# Patient Record
Sex: Male | Born: 1955 | Race: White | Hispanic: No | Marital: Married | Smoking: Former smoker
Health system: Southern US, Community
[De-identification: ages and names within clinical notes are randomized; demographics above are authoritative.]

## PROBLEM LIST (undated history)

## (undated) DIAGNOSIS — N289 Disorder of kidney and ureter, unspecified: Secondary | ICD-10-CM

## (undated) DIAGNOSIS — K219 Gastro-esophageal reflux disease without esophagitis: Secondary | ICD-10-CM

## (undated) DIAGNOSIS — F419 Anxiety disorder, unspecified: Secondary | ICD-10-CM

## (undated) DIAGNOSIS — E785 Hyperlipidemia, unspecified: Secondary | ICD-10-CM

## (undated) DIAGNOSIS — M1990 Unspecified osteoarthritis, unspecified site: Secondary | ICD-10-CM

---

## 2006-03-18 DIAGNOSIS — C801 Malignant (primary) neoplasm, unspecified: Secondary | ICD-10-CM

## 2006-03-18 HISTORY — DX: Malignant (primary) neoplasm, unspecified: C80.1

## 2006-03-18 HISTORY — PX: LUNG REMOVAL, PARTIAL: SHX233

## 2020-03-18 HISTORY — PX: ABDOMINAL AORTIC ANEURYSM REPAIR: SUR1152

## 2021-09-06 ENCOUNTER — Other Ambulatory Visit: Payer: Self-pay

## 2021-09-06 ENCOUNTER — Encounter (HOSPITAL_COMMUNITY): Payer: Self-pay

## 2021-09-06 ENCOUNTER — Emergency Department (HOSPITAL_COMMUNITY): Payer: Medicare PPO

## 2021-09-06 ENCOUNTER — Emergency Department (HOSPITAL_COMMUNITY)
Admission: EM | Admit: 2021-09-06 | Discharge: 2021-09-07 | Disposition: A | Discharge status: Home / Self Care | Payer: Medicare PPO | Attending: Emergency Medicine | Admitting: Emergency Medicine

## 2021-09-06 DIAGNOSIS — R079 Chest pain, unspecified: Secondary | ICD-10-CM

## 2021-09-06 DIAGNOSIS — R0602 Shortness of breath: Secondary | ICD-10-CM | POA: Diagnosis not present

## 2021-09-06 DIAGNOSIS — Z85118 Personal history of other malignant neoplasm of bronchus and lung: Secondary | ICD-10-CM | POA: Insufficient documentation

## 2021-09-06 DIAGNOSIS — R072 Precordial pain: Secondary | ICD-10-CM | POA: Diagnosis not present

## 2021-09-06 LAB — CBC
HCT: 50.4 % (ref 39.0–52.0)
Hemoglobin: 16.3 g/dL (ref 13.0–17.0)
MCH: 27.3 pg (ref 26.0–34.0)
MCHC: 32.3 g/dL (ref 30.0–36.0)
MCV: 84.3 fL (ref 80.0–100.0)
Platelets: 189 10*3/uL (ref 150–400)
RBC: 5.98 MIL/uL — ABNORMAL HIGH (ref 4.22–5.81)
RDW: 15 % (ref 11.5–15.5)
WBC: 7.4 10*3/uL (ref 4.0–10.5)
nRBC: 0 % (ref 0.0–0.2)

## 2021-09-06 LAB — BASIC METABOLIC PANEL
Anion gap: 9 (ref 5–15)
BUN: 19 mg/dL (ref 8–23)
CO2: 26 mmol/L (ref 22–32)
Calcium: 9.5 mg/dL (ref 8.9–10.3)
Chloride: 102 mmol/L (ref 98–111)
Creatinine, Ser: 1.29 mg/dL — ABNORMAL HIGH (ref 0.61–1.24)
GFR, Estimated: >60 mL/min (ref >60)
Glucose, Bld: 94 mg/dL (ref 70–99)
Potassium: 4.3 mmol/L (ref 3.5–5.1)
Sodium: 137 mmol/L (ref 135–145)

## 2021-09-06 LAB — TROPONIN I (HIGH SENSITIVITY)
Troponin I (High Sensitivity): 4 ng/L (ref <18)
Troponin I (High Sensitivity): 4 ng/L (ref <18)

## 2021-09-06 MED ORDER — FAMOTIDINE IN NACL 20-0.9 MG/50ML-% IV SOLN
20.0000 mg | Freq: Once | INTRAVENOUS | Status: AC
  Administered 2021-09-06: 20 mg via INTRAVENOUS
  Filled 2021-09-06: qty 50

## 2021-09-06 MED ORDER — FENTANYL CITRATE PF 50 MCG/ML IJ SOSY
100.0000 ug | PREFILLED_SYRINGE | Freq: Once | INTRAMUSCULAR | Status: AC
  Administered 2021-09-06: 100 ug via INTRAVENOUS
  Filled 2021-09-06: qty 2

## 2021-09-06 MED ORDER — DIPHENHYDRAMINE HCL 25 MG PO CAPS
50.0000 mg | ORAL_CAPSULE | Freq: Once | ORAL | Status: AC
  Administered 2021-09-07: 50 mg via ORAL

## 2021-09-06 MED ORDER — DIPHENHYDRAMINE HCL 50 MG/ML IJ SOLN
50.0000 mg | Freq: Once | INTRAMUSCULAR | Status: AC
  Administered 2021-09-06: 50 mg via INTRAVENOUS
  Filled 2021-09-06: qty 1

## 2021-09-06 MED ORDER — METHYLPREDNISOLONE SODIUM SUCC 40 MG IJ SOLR
40.0000 mg | Freq: Once | INTRAMUSCULAR | Status: AC
Start: 2021-09-06 — End: 2021-09-06
  Administered 2021-09-06: 40 mg via INTRAVENOUS
  Filled 2021-09-06: qty 1

## 2021-09-06 MED ORDER — DIPHENHYDRAMINE HCL 25 MG PO CAPS
50.0000 mg | ORAL_CAPSULE | Freq: Once | ORAL | Status: AC
Start: 2021-09-06 — End: 2021-09-06
  Filled 2021-09-06: qty 2

## 2021-09-06 MED ORDER — ALUM & MAG HYDROXIDE-SIMETH 200-200-20 MG/5ML PO SUSP
30.0000 mL | Freq: Once | ORAL | Status: AC
  Administered 2021-09-06: 30 mL via ORAL
  Filled 2021-09-06: qty 30

## 2021-09-06 MED ORDER — LIDOCAINE VISCOUS HCL 2 % MT SOLN
15.0000 mL | Freq: Once | OROMUCOSAL | Status: AC
  Administered 2021-09-06: 15 mL via ORAL
  Filled 2021-09-06: qty 15

## 2021-09-06 NOTE — ED Provider Notes (Signed)
[facility] EMERGENCY DEPARTMENT Provider Note   CSN: [id number] Arrival date & time: 09/06/21  1427     History  Chief Complaint  Patient presents with   Chest Pain    Troy Warner is a 66 y.o. male.  HPI  66 year old male with past medical history of AAA status post repair, lung [place]) status post lobectomy presents emergency department with concern of midsternal chest pain that started about 1:00 today.  Patient states this was associated with shortness of breath.  States radiated to his neck but did not radiate to his back or abdomen.  Patient said it initially felt like reflux, he took over-the-counter medication without any significant improvement.  Patient did not have any complications status post AAA repair.  Denies any recent fever or other acute illness.  Home Medications Prior to Admission medications   Not on File      Allergies    Ivp dye [iodinated contrast media], Morphine, and Plaquenil [hydroxychloroquine]    Review of Systems   Review of Systems  Constitutional:  Positive for fatigue. Negative for fever.  Respiratory:  Positive for shortness of breath.   Cardiovascular:  Positive for chest pain.  Gastrointestinal:  Negative for abdominal pain, diarrhea and vomiting.  Musculoskeletal:  Positive for back pain.  Skin:  Negative for rash.  Neurological:  Negative for headaches.    Physical Exam Updated Vital Signs BP (!) 126/91   Pulse (!) 104   Temp 98.6 F (37 C) (Oral)   Resp 19   SpO2 95%  Physical Exam Vitals and nursing note reviewed.  Constitutional:      General: He is not in acute distress.    Appearance: Normal appearance.  HENT:     Head: Normocephalic.     Mouth/Throat:     Mouth: Mucous membranes are moist.  Cardiovascular:     Rate and Rhythm: Normal rate.  Pulmonary:     Effort: Pulmonary effort is normal. No respiratory distress.     Breath sounds: No decreased breath sounds.   Abdominal:     Palpations: Abdomen is soft.     Tenderness: There is no abdominal tenderness.  Skin:    General: Skin is warm.  Neurological:     Mental Status: He is alert and oriented to person, place, and time. Mental status is at baseline.  Psychiatric:        Mood and Affect: Mood normal.     ED Results / Procedures / Treatments   Labs (all labs ordered are listed, but only abnormal results are displayed) Labs Reviewed  BASIC METABOLIC PANEL - Abnormal; Notable for the following components:      Result Value   Creatinine, Ser 1.29 (*)    All other components within normal limits  CBC - Abnormal; Notable for the following components:   RBC 5.98 (*)    All other components within normal limits  CBG MONITORING, ED  TROPONIN I (HIGH SENSITIVITY)  TROPONIN I (HIGH SENSITIVITY)    EKG EKG Interpretation  Date/Time:  Thursday September 06 2021 14:34:04 EDT Ventricular Rate:  94 PR Interval:  192 QRS Duration: 82 QT Interval:  316 QTC Calculation: 395 R Axis:   5 Text Interpretation: Normal sinus rhythm Normal ECG No previous ECGs available Confirmed by Coralee Pesa [phone]) on 09/06/2021 8:01:50 PM  Radiology DG Chest 2 View  Result Date: 09/06/2021 CLINICAL DATA:  Chest pain.  History of right lobectomy in 2008. EXAM: CHEST -  2 VIEW COMPARISON:  None Available. FINDINGS: Cardiac silhouette appears mildly enlarged. Mediastinal contours are within normal limits with moderately tortuous thoracic aorta seen. Surgical clips overlie the right hilum. There is mildly decreased right lung volume consistent with prior lobectomy. Old healed fracture versus thoracotomy site within the posterolateral right sixth rib. No focal airspace opacity to indicate pneumonia. Blunting of the lateral and anterior right costophrenic angles presumably postsurgical. No pleural effusion is seen at the posterior costophrenic angles on lateral view. No pneumothorax is seen. Mild multilevel degenerative disc  changes of the thoracic spine. Surgical clips overlie the upper abdomen. Possible vascular stent in this region. IMPRESSION: 1. Postsurgical changes of right lobectomy with right-sided volume loss. 2. No definite acute lung process. Electronically Signed   By: Neita Garnet M.D.   On: 09/06/2021 14:58    Procedures Procedures    Medications Ordered in ED Medications  famotidine (PEPCID) IVPB 20 mg premix (20 mg Intravenous New Bag/Given 09/06/21 2059)  fentaNYL (SUBLIMAZE) injection 100 mcg (100 mcg Intravenous Given 09/06/21 2056)  methylPREDNISolone sodium succinate (SOLU-MEDROL) 40 mg/mL injection 40 mg (40 mg Intravenous Given 09/06/21 2055)  diphenhydrAMINE (BENADRYL) capsule 50 mg ( Oral See Alternative 09/06/21 2103)    Or  diphenhydrAMINE (BENADRYL) injection 50 mg (50 mg Intravenous Given 09/06/21 2103)  alum & mag hydroxide-simeth (MAALOX/MYLANTA) 200-200-20 MG/5ML suspension 30 mL (30 mLs Oral Given 09/06/21 2059)    And  lidocaine (XYLOCAINE) 2 % viscous mouth solution 15 mL (15 mLs Oral Given 09/06/21 2059)    ED Course/ Medical Decision Making/ A&P                           Medical Decision Making Amount and/or Complexity of Data Reviewed Radiology: ordered.  Risk OTC drugs. Prescription drug management.   66 year old male with past medical history of lobectomy and [place] the emergency department with chest pain and shortness of breath.  Patient at 1 point was tachycardic, normal vitals on my evaluation.  Complaining of ongoing severe midsternal chest discomfort that radiates to his neck.  EKG shows no ischemic changes.  Cardiac work-up is reassuring with negative troponins, blood work is otherwise normal.  After IV medications, GI meds he feels improved.  However given his prominent history we will pursue imaging of CT chest/abdomen/pelvis to rule out any acute vascular abnormality.  Patient has contrast allergy that involves itching.  He  has been pretreated and scanned before without issue.  We initiated pretreatment.  1203: Patient reevaluated, sleeping.  States that his symptoms are improved.  Offers no new complaints.  Plan to take the patient for CT in about an hour after another pretreatment dose.  Patient signed out pending CT imaging and reevaluation.        Final Clinical Impression(s) / ED Diagnoses Final diagnoses:  None    Rx / DC Orders ED Discharge Orders     None         Rozelle Logan, DO 09/07/21 0004

## 2021-09-06 NOTE — ED Triage Notes (Signed)
Patient complains of chest pain with radiation to neck and arms starting at approximately 1400 today. Patient is alert, oriented, and in no apparent distress at this time.

## 2021-09-07 ENCOUNTER — Emergency Department (HOSPITAL_COMMUNITY): Payer: Medicare PPO

## 2021-09-07 LAB — CREATININE, SERUM
Creatinine, Ser: 0.94 mg/dL (ref 0.61–1.24)
GFR, Estimated: >60 mL/min (ref >60)

## 2021-09-07 MED ORDER — PANTOPRAZOLE SODIUM 40 MG PO TBEC: 40.0000 mg | DELAYED_RELEASE_TABLET | Freq: Every day | ORAL | 0 refills | Status: AC

## 2021-09-07 MED ORDER — PANTOPRAZOLE SODIUM 40 MG PO TBEC
40.0000 mg | DELAYED_RELEASE_TABLET | Freq: Once | ORAL | Status: AC
  Administered 2021-09-07: 40 mg via ORAL
  Filled 2021-09-07: qty 1

## 2021-09-07 MED ORDER — IOHEXOL 350 MG/ML SOLN
100.0000 mL | Freq: Once | INTRAVENOUS | Status: AC | PRN
  Administered 2021-09-07: 100 mL via INTRAVENOUS

## 2022-09-07 ENCOUNTER — Emergency Department (HOSPITAL_BASED_OUTPATIENT_CLINIC_OR_DEPARTMENT_OTHER): Payer: Medicare PPO

## 2022-09-07 ENCOUNTER — Inpatient Hospital Stay (HOSPITAL_BASED_OUTPATIENT_CLINIC_OR_DEPARTMENT_OTHER)
Admission: EM | Admit: 2022-09-07 | Discharge: 2022-09-09 | DRG: 253 | Disposition: A | Discharge status: Home / Self Care | Payer: Medicare PPO | Attending: Vascular Surgery | Admitting: Vascular Surgery

## 2022-09-07 ENCOUNTER — Other Ambulatory Visit: Payer: Self-pay

## 2022-09-07 ENCOUNTER — Encounter (HOSPITAL_BASED_OUTPATIENT_CLINIC_OR_DEPARTMENT_OTHER): Payer: Self-pay | Admitting: Emergency Medicine

## 2022-09-07 DIAGNOSIS — Z7902 Long term (current) use of antithrombotics/antiplatelets: Secondary | ICD-10-CM | POA: Diagnosis not present

## 2022-09-07 DIAGNOSIS — Z885 Allergy status to narcotic agent status: Secondary | ICD-10-CM

## 2022-09-07 DIAGNOSIS — Z888 Allergy status to other drugs, medicaments and biological substances status: Secondary | ICD-10-CM | POA: Diagnosis not present

## 2022-09-07 DIAGNOSIS — Z87891 Personal history of nicotine dependence: Secondary | ICD-10-CM

## 2022-09-07 DIAGNOSIS — N182 Chronic kidney disease, stage 2 (mild): Secondary | ICD-10-CM | POA: Diagnosis present

## 2022-09-07 DIAGNOSIS — Z79899 Other long term (current) drug therapy: Secondary | ICD-10-CM

## 2022-09-07 DIAGNOSIS — Z91041 Radiographic dye allergy status: Secondary | ICD-10-CM | POA: Diagnosis not present

## 2022-09-07 DIAGNOSIS — I998 Other disorder of circulatory system: Secondary | ICD-10-CM | POA: Diagnosis not present

## 2022-09-07 DIAGNOSIS — E785 Hyperlipidemia, unspecified: Secondary | ICD-10-CM | POA: Diagnosis present

## 2022-09-07 DIAGNOSIS — M549 Dorsalgia, unspecified: Secondary | ICD-10-CM | POA: Diagnosis present

## 2022-09-07 DIAGNOSIS — Z8679 Personal history of other diseases of the circulatory system: Secondary | ICD-10-CM | POA: Diagnosis not present

## 2022-09-07 DIAGNOSIS — I739 Peripheral vascular disease, unspecified: Secondary | ICD-10-CM | POA: Diagnosis not present

## 2022-09-07 DIAGNOSIS — I70222 Atherosclerosis of native arteries of extremities with rest pain, left leg: Principal | ICD-10-CM | POA: Diagnosis present

## 2022-09-07 DIAGNOSIS — I743 Embolism and thrombosis of arteries of the lower extremities: Secondary | ICD-10-CM | POA: Diagnosis not present

## 2022-09-07 DIAGNOSIS — I82412 Acute embolism and thrombosis of left femoral vein: Secondary | ICD-10-CM | POA: Diagnosis present

## 2022-09-07 DIAGNOSIS — N289 Disorder of kidney and ureter, unspecified: Secondary | ICD-10-CM | POA: Diagnosis not present

## 2022-09-07 DIAGNOSIS — M79662 Pain in left lower leg: Secondary | ICD-10-CM | POA: Diagnosis present

## 2022-09-07 DIAGNOSIS — M62262 Nontraumatic ischemic infarction of muscle, left lower leg: Secondary | ICD-10-CM | POA: Diagnosis not present

## 2022-09-07 DIAGNOSIS — I509 Heart failure, unspecified: Secondary | ICD-10-CM | POA: Diagnosis not present

## 2022-09-07 DIAGNOSIS — I70202 Unspecified atherosclerosis of native arteries of extremities, left leg: Principal | ICD-10-CM

## 2022-09-07 DIAGNOSIS — Z7982 Long term (current) use of aspirin: Secondary | ICD-10-CM

## 2022-09-07 HISTORY — DX: Disorder of kidney and ureter, unspecified: N28.9

## 2022-09-07 HISTORY — DX: Hyperlipidemia, unspecified: E78.5

## 2022-09-07 HISTORY — DX: Unspecified osteoarthritis, unspecified site: M19.90

## 2022-09-07 HISTORY — DX: Gastro-esophageal reflux disease without esophagitis: K21.9

## 2022-09-07 HISTORY — DX: Anxiety disorder, unspecified: F41.9

## 2022-09-07 LAB — BASIC METABOLIC PANEL
Anion gap: 10 (ref 5–15)
BUN: 16 mg/dL (ref 8–23)
CO2: 25 mmol/L (ref 22–32)
Calcium: 9.2 mg/dL (ref 8.9–10.3)
Chloride: 103 mmol/L (ref 98–111)
Creatinine, Ser: 1.59 mg/dL — ABNORMAL HIGH (ref 0.61–1.24)
GFR, Estimated: 47 mL/min — ABNORMAL LOW (ref >60)
Glucose, Bld: 94 mg/dL (ref 70–99)
Potassium: 3.9 mmol/L (ref 3.5–5.1)
Sodium: 138 mmol/L (ref 135–145)

## 2022-09-07 LAB — CBC WITH DIFFERENTIAL/PLATELET
Abs Immature Granulocytes: 0.01 10*3/uL (ref 0.00–0.07)
Basophils Absolute: 0 10*3/uL (ref 0.0–0.1)
Basophils Relative: 0 %
Eosinophils Absolute: 0.1 10*3/uL (ref 0.0–0.5)
Eosinophils Relative: 2 %
HCT: 48.5 % (ref 39.0–52.0)
Hemoglobin: 16.1 g/dL (ref 13.0–17.0)
Immature Granulocytes: 0 %
Lymphocytes Relative: 37 %
Lymphs Abs: 2 10*3/uL (ref 0.7–4.0)
MCH: 28.8 pg (ref 26.0–34.0)
MCHC: 33.2 g/dL (ref 30.0–36.0)
MCV: 86.8 fL (ref 80.0–100.0)
Monocytes Absolute: 0.7 10*3/uL (ref 0.1–1.0)
Monocytes Relative: 13 %
Neutro Abs: 2.6 10*3/uL (ref 1.7–7.7)
Neutrophils Relative %: 48 %
Platelets: 165 10*3/uL (ref 150–400)
RBC: 5.59 MIL/uL (ref 4.22–5.81)
RDW: 13.7 % (ref 11.5–15.5)
WBC: 5.4 10*3/uL (ref 4.0–10.5)
nRBC: 0 % (ref 0.0–0.2)

## 2022-09-07 MED ORDER — METOPROLOL TARTRATE 5 MG/5ML IV SOLN: 2.0000 mg | INTRAVENOUS | Status: DC | PRN

## 2022-09-07 MED ORDER — PANTOPRAZOLE SODIUM 40 MG PO TBEC: 40.0000 mg | DELAYED_RELEASE_TABLET | Freq: Every day | ORAL | Status: DC

## 2022-09-07 MED ORDER — LABETALOL HCL 5 MG/ML IV SOLN: 10.0000 mg | INTRAVENOUS | Status: DC | PRN

## 2022-09-07 MED ORDER — HEPARIN (PORCINE) 25000 UT/250ML-% IV SOLN
1700.0000 [IU]/h | INTRAVENOUS | Status: DC
  Administered 2022-09-07: 1700 [IU]/h via INTRAVENOUS
  Filled 2022-09-07: qty 250

## 2022-09-07 MED ORDER — ALUM & MAG HYDROXIDE-SIMETH 200-200-20 MG/5ML PO SUSP: 15.0000 mL | ORAL | Status: DC | PRN

## 2022-09-07 MED ORDER — HYDRALAZINE HCL 20 MG/ML IJ SOLN: 5.0000 mg | INTRAMUSCULAR | Status: DC | PRN

## 2022-09-07 MED ORDER — DIPHENHYDRAMINE HCL 25 MG PO CAPS: 50.0000 mg | ORAL_CAPSULE | Freq: Once | ORAL | Status: AC

## 2022-09-07 MED ORDER — HEPARIN BOLUS VIA INFUSION: 6000.0000 [IU] | Freq: Once | INTRAVENOUS | Status: DC

## 2022-09-07 MED ORDER — HEPARIN BOLUS VIA INFUSION
5500.0000 [IU] | Freq: Once | INTRAVENOUS | Status: AC
  Administered 2022-09-07: 5500 [IU] via INTRAVENOUS

## 2022-09-07 MED ORDER — PHENOL 1.4 % MT LIQD: 1.0000 | OROMUCOSAL | Status: DC | PRN

## 2022-09-07 MED ORDER — ONDANSETRON HCL 4 MG/2ML IJ SOLN: 4.0000 mg | Freq: Four times a day (QID) | INTRAMUSCULAR | Status: DC | PRN

## 2022-09-07 MED ORDER — POTASSIUM CHLORIDE CRYS ER 20 MEQ PO TBCR
20.0000 meq | EXTENDED_RELEASE_TABLET | Freq: Once | ORAL | Status: AC
  Administered 2022-09-08: 20 meq via ORAL
  Filled 2022-09-07: qty 1

## 2022-09-07 MED ORDER — METHYLPREDNISOLONE SODIUM SUCC 40 MG IJ SOLR
40.0000 mg | Freq: Once | INTRAMUSCULAR | Status: AC
  Administered 2022-09-07: 40 mg via INTRAVENOUS
  Filled 2022-09-07: qty 1

## 2022-09-07 MED ORDER — DIPHENHYDRAMINE HCL 50 MG/ML IJ SOLN
50.0000 mg | Freq: Once | INTRAMUSCULAR | Status: AC
  Administered 2022-09-07: 50 mg via INTRAVENOUS
  Filled 2022-09-07: qty 1

## 2022-09-07 MED ORDER — SODIUM CHLORIDE 0.9 % IV BOLUS
1000.0000 mL | Freq: Once | INTRAVENOUS | Status: AC
  Administered 2022-09-07: 1000 mL via INTRAVENOUS

## 2022-09-07 MED ORDER — IOHEXOL 350 MG/ML SOLN
100.0000 mL | Freq: Once | INTRAVENOUS | Status: AC | PRN
  Administered 2022-09-07: 100 mL via INTRAVENOUS

## 2022-09-07 MED ORDER — KCL IN DEXTROSE-NACL 20-5-0.45 MEQ/L-%-% IV SOLN
INTRAVENOUS | Status: DC
  Filled 2022-09-07 (×2): qty 1000

## 2022-09-07 MED ORDER — CEFAZOLIN SODIUM-DEXTROSE 2-4 GM/100ML-% IV SOLN
2.0000 g | INTRAVENOUS | Status: AC
  Administered 2022-09-08: 2 g via INTRAVENOUS

## 2022-09-07 MED ORDER — HYDROMORPHONE HCL 1 MG/ML IJ SOLN
0.5000 mg | INTRAMUSCULAR | Status: DC | PRN
  Administered 2022-09-08 – 2022-09-09 (×7): 1 mg via INTRAVENOUS
  Filled 2022-09-07 (×7): qty 1

## 2022-09-07 MED ORDER — GUAIFENESIN-DM 100-10 MG/5ML PO SYRP: 15.0000 mL | ORAL_SOLUTION | ORAL | Status: DC | PRN

## 2022-09-07 NOTE — ED Notes (Signed)
[facility] at bedside 

## 2022-09-07 NOTE — H&P (Incomplete)
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  He presents with occlusion of the left popliteal artery on CT scan and also evidence of a filling defect in the popliteal artery and tibial peroneal trunk.  It is not clear the etiology of this but this likely occurred 9 days ago on 08/29/2022.  He is currently on heparin and has a dampened and monophasic dorsalis pedis signal with a Doppler.  He is able to move the foot and has some paresthesias in the left foot.  His symptoms have been unchanged since the 13th.  I have recommended left femoral thrombectomy, popliteal and tibial thrombectomy.  He will possibly require fasciotomies.  There is a very small chance he would require a right to left femoral-femoral bypass.  Will leave him on heparin tonight and he is scheduled for surgery first thing in the morning.  I have reviewed the indications for the procedure and the potential complications with the patient and he is agreeable to proceed.  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  HPI:   Troy Warner is a 67 y.o. male who underwent endovascular aneurysm repair in 2009 in [place].  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  He subsequently underwent a FEVAR in 2022 by Dr. Shirl Harris at [facility].  He does admit to some left calf claudication but on June 13 he developed the sudden onset of pain in the left calf and numbness in the foot.  He was evaluated elsewhere where apparently had a venous duplex scan that was unremarkable.  His symptoms have persisted since that time and ultimately he elected to go to the emergency department today.  He underwent a CT angiogram at [facility] which showed occlusion of the left femoral artery with also some filling defect in the left popliteal artery and tibial peroneal trunk.  He was transferred to [facility] for further  evaluation.  His symptoms have not changed significantly since June 13.  The foot has been numb and he has had left foot pain.  Of note he just recently moved here from [place].  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  He quit smoking 23 years ago.  Past Medical History:  Diagnosis Date  . Cancer [facility]) 2008   Right lung  . Hyperlipidemia   . Renal disorder     History reviewed. No pertinent family history.  SOCIAL HISTORY: Social History   Tobacco Use  . Smoking status: Former    Types: Cigarettes    Passive exposure: Never  . Smokeless tobacco: Never  Substance Use Topics  . Alcohol use: Not Currently    Allergies  Allergen Reactions  . Iohexol Itching    Does well with pre-medication   . Ivp Dye [Iodinated Contrast Media] Itching    Does well with pre-medication  . Morphine Other (See Comments)    Delerium  . Otezla [Apremilast] Diarrhea and Nausea And Vomiting  . Plaquenil [Hydroxychloroquine]     Current Facility-Administered Medications  Medication Dose Route Frequency Provider Last Rate Last Admin  . alum & mag hydroxide-simeth (MAALOX/MYLANTA) 200-200-20 MG/5ML suspension 15-30 mL  15-30 mL Oral Q2H PRN Chuck Hint, MD      . Melene Muller ON 09/08/2022] ceFAZolin (ANCEF) IVPB 2g/100 mL  premix  2 g Intravenous On Call Chuck Hint, MD      . Melene Muller ON 09/08/2022] dextrose 5 % and 0.45 % NaCl with KCl 20 mEq/L infusion   Intravenous Continuous Chuck Hint, MD      . guaiFENesin-dextromethorphan [facility] DM) 100-10 MG/5ML syrup 15 mL  15 mL Oral Q4H PRN Chuck Hint, MD      . heparin ADULT infusion 100 units/mL (25000 units/224mL)  1,700 Units/hr Intravenous Continuous Rexford Maus, DO 17 mL/hr at 09/07/22 2135 1,700 Units/hr at 09/07/22 2135  . hydrALAZINE (APRESOLINE) injection 5 mg  5 mg Intravenous Q20 Min PRN  Chuck Hint, MD      . HYDROmorphone (DILAUDID) injection 0.5-1 mg  0.5-1 mg Intravenous Q2H PRN Chuck Hint, MD      . labetalol (NORMODYNE) injection 10 mg  10 mg Intravenous Q10 min PRN Chuck Hint, MD      . metoprolol tartrate (LOPRESSOR) injection 2-5 mg  2-5 mg Intravenous Q2H PRN Chuck Hint, MD      . ondansetron [facility]) injection 4 mg  4 mg Intravenous Q6H PRN Chuck Hint, MD      . Melene Muller ON 09/08/2022] pantoprazole (PROTONIX) EC tablet 40 mg  40 mg Oral Daily Chuck Hint, MD      . phenol [facility]) mouth spray 1 spray  1 spray Mouth/Throat PRN Chuck Hint, MD      . Melene Muller ON 09/08/2022] potassium chloride SA (KLOR-CON M) CR tablet 20-40 mEq  20-40 mEq Oral Once Chuck Hint, MD       Current Outpatient Medications  Medication Sig Dispense Refill  . aspirin EC 81 MG tablet Take 81 mg by mouth every evening. Swallow whole.    . chlorpheniramine (CHLOR-TRIMETON) 4 MG tablet Take 4 mg by mouth 2 (two) times daily as needed for allergies.    Marland Kitchen clopidogrel (PLAVIX) 75 MG tablet Take 75 mg by mouth every evening.    . docusate sodium (COLACE) 100 MG capsule Take 100 mg by mouth at bedtime.    . famotidine (PEPCID AC) 10 MG tablet Take 10 mg by mouth daily as needed for heartburn or indigestion.    Marland Kitchen HYDROcodone-acetaminophen (NORCO) 10-325 MG tablet Take 1 tablet by mouth 3 (three) times daily as needed for moderate pain or severe pain.    Marland Kitchen LORazepam (ATIVAN) 1 MG tablet Take 1 mg by mouth daily as needed for anxiety.    . nicotine polacrilex (COMMIT) 4 MG lozenge Take 4 mg by mouth daily as needed for smoking cessation.    . pantoprazole (PROTONIX) 40 MG tablet Take 1 tablet (40 mg total) by mouth daily. 30 tablet 0  . simethicone (MYLICON) 80 MG chewable tablet Chew 80 mg by mouth every 6 (six) hours as needed for flatulence.      REVIEW OF SYSTEMS:  [X]  denotes positive finding, [ ]  denotes  negative finding Cardiac  Comments:  Chest pain or chest pressure:    Shortness of breath upon exertion:    Short of breath when lying flat:    Irregular heart rhythm:        Vascular    Pain in calf, thigh, or hip brought on by ambulation: x   Pain in feet at night that wakes you up from your sleep:  x   Blood clot in your veins:    Leg swelling:         Pulmonary  Oxygen at home:    Productive cough:     Wheezing:         Neurologic    Sudden weakness in arms or legs:     Sudden numbness in arms or legs:  x   Sudden onset of difficulty speaking or slurred speech:    Temporary loss of vision in one eye:     Problems with dizziness:         Gastrointestinal    Blood in stool:     Vomited blood:         Genitourinary    Burning when urinating:     Blood in urine:        Psychiatric    Major depression:         Hematologic    Bleeding problems:    Problems with blood clotting too easily:        Skin    Rashes or ulcers:        Constitutional    Fever or chills:    -  PHYSICAL EXAM:   Vitals:   09/07/22 2000 09/07/22 2200 09/07/22 2345 09/07/22 2349  BP: (!) 137/90 129/81 (!) 138/91   Pulse: (!) 104 (!) 115 (!) 107   Resp: 19 20 (!) 21   Temp:    98.4 F (36.9 C)  TempSrc:    Oral  SpO2: 93% 93% 93%   Weight:      Height:       Body mass index is 34.87 kg/m. GENERAL: The patient is a well-nourished male, in no acute distress. The vital signs are documented above. CARDIAC: There is a regular rate and rhythm.  VASCULAR: I do not detect carotid bruits. He has a palpable right femoral pulse.  He has a palpable right dorsalis pedis pulse. I cannot palpate a left femoral pulse. He has a dampened monophasic dorsalis pedis signal with a Doppler. PULMONARY: There is good air exchange bilaterally without wheezing or rales. ABDOMEN: Soft and non-tender with normal pitched bowel sounds.  MUSCULOSKELETAL: There are no major deformities. NEUROLOGIC: He has  some paresthesias in the left foot but does have intact sensation and motor function. SKIN: There are no ulcers or rashes noted. PSYCHIATRIC: The patient has a normal affect.  DATA:    CT ANGIO: I reviewed the images of his CT angiogram.  This shows occlusion of the left femoral artery and also of the popliteal artery and tibial peroneal trunk.    Waverly Ferrari [facility]

## 2022-09-07 NOTE — ED Notes (Signed)
Vascular doctor Edilia Bo came bedside, unable to find pulse with doppler. Scheduled for thrombectomy in the morning.

## 2022-09-07 NOTE — ED Notes (Signed)
Doppler used on left foot, no pulse able to be found. MD Lynelle Doctor made aware, and vascular Dr. Edilia Bo notified via phone.

## 2022-09-07 NOTE — H&P (Signed)
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  He presents with occlusion of the left popliteal artery on CT scan and also evidence of a filling defect in the popliteal artery and tibial peroneal trunk.  It is not clear the etiology of this but this likely occurred 9 days ago on 08/29/2022.  He is currently on heparin and has a dampened and monophasic dorsalis pedis signal with a Doppler.  He is able to move the foot and has some paresthesias in the left foot.  His symptoms have been unchanged since the 13th.  I have recommended left femoral thrombectomy, popliteal and tibial thrombectomy.  He will possibly require fasciotomies.  There is a very small chance he would require a right to left femoral-femoral bypass.  Will leave him on heparin tonight and he is scheduled for surgery first thing in the morning.  I have reviewed the indications for the procedure and the potential complications with the patient and he is agreeable to proceed.  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  HPI:   Troy Warner is a 67 y.o. male who underwent endovascular aneurysm repair in 2009 in [place].  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  He subsequently underwent a FEVAR in 2022 by Dr. Shirl Harris at [facility].  He does admit to some left calf claudication but on June 13 he developed the sudden onset of pain in the left calf and numbness in the foot.  He was evaluated elsewhere where apparently had a venous duplex scan that was unremarkable.  His symptoms have persisted since that time and ultimately he elected to go to the emergency department today.  He underwent a CT angiogram at [facility] which showed occlusion of the left femoral artery with also some filling defect in the left popliteal artery and tibial peroneal trunk.  He was transferred to [facility] for further  evaluation.  His symptoms have not changed significantly since June 13.  The foot has been numb and he has had left foot pain.  Of note he just recently moved here from [place].  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  He quit smoking 23 years ago.  Past Medical History:  Diagnosis Date   Cancer [facility]) 2008   Right lung   Hyperlipidemia    Renal disorder     History reviewed. No pertinent family history.  SOCIAL HISTORY: Social History   Tobacco Use   Smoking status: Former    Types: Cigarettes    Passive exposure: Never   Smokeless tobacco: Never  Substance Use Topics   Alcohol use: Not Currently    Allergies  Allergen Reactions   Iohexol Itching    Does well with pre-medication    Ivp Dye [Iodinated Contrast Media] Itching    Does well with pre-medication   Morphine Other (See Comments)    Delerium   Otezla [Apremilast] Diarrhea and Nausea And Vomiting   Plaquenil [Hydroxychloroquine]     Current Facility-Administered Medications  Medication Dose Route Frequency Provider Last Rate Last Admin   alum & mag hydroxide-simeth (MAALOX/MYLANTA) 200-200-20 MG/5ML suspension 15-30 mL  15-30 mL Oral Q2H PRN Chuck Hint, MD       [START ON 09/08/2022] ceFAZolin (ANCEF) IVPB 2g/100 mL  premix  2 g Intravenous On Call Chuck Hint, MD       [START ON 09/08/2022] dextrose 5 % and 0.45 % NaCl with KCl 20 mEq/L infusion   Intravenous Continuous Chuck Hint, MD       guaiFENesin-dextromethorphan (ROBITUSSIN DM) 100-10 MG/5ML syrup 15 mL  15 mL Oral Q4H PRN Chuck Hint, MD       heparin ADULT infusion 100 units/mL (25000 units/249mL)  1,700 Units/hr Intravenous Continuous Rexford Maus, DO 17 mL/hr at 09/07/22 2135 1,700 Units/hr at 09/07/22 2135   hydrALAZINE (APRESOLINE) injection 5 mg  5 mg Intravenous Q20 Min PRN Chuck Hint, MD       HYDROmorphone (DILAUDID) injection 0.5-1 mg  0.5-1 mg Intravenous Q2H PRN Chuck Hint, MD       labetalol (NORMODYNE) injection 10 mg  10 mg Intravenous Q10 min PRN Chuck Hint, MD       metoprolol tartrate (LOPRESSOR) injection 2-5 mg  2-5 mg Intravenous Q2H PRN Chuck Hint, MD       ondansetron [facility]) injection 4 mg  4 mg Intravenous Q6H PRN Chuck Hint, MD       [START ON 09/08/2022] pantoprazole (PROTONIX) EC tablet 40 mg  40 mg Oral Daily Chuck Hint, MD       phenol (CHLORASEPTIC) mouth spray 1 spray  1 spray Mouth/Throat PRN Chuck Hint, MD       [START ON 09/08/2022] potassium chloride SA (KLOR-CON M) CR tablet 20-40 mEq  20-40 mEq Oral Once Chuck Hint, MD       Current Outpatient Medications  Medication Sig Dispense Refill   aspirin EC 81 MG tablet Take 81 mg by mouth every evening. Swallow whole.     chlorpheniramine (CHLOR-TRIMETON) 4 MG tablet Take 4 mg by mouth 2 (two) times daily as needed for allergies.     clopidogrel (PLAVIX) 75 MG tablet Take 75 mg by mouth every evening.     docusate sodium (COLACE) 100 MG capsule Take 100 mg by mouth at bedtime.     famotidine (PEPCID AC) 10 MG tablet Take 10 mg by mouth daily as needed for heartburn or indigestion.     HYDROcodone-acetaminophen (NORCO) 10-325 MG tablet Take 1 tablet by mouth 3 (three) times daily as needed for moderate pain or severe pain.     LORazepam (ATIVAN) 1 MG tablet Take 1 mg by mouth daily as needed for anxiety.     nicotine polacrilex (COMMIT) 4 MG lozenge Take 4 mg by mouth daily as needed for smoking cessation.     pantoprazole (PROTONIX) 40 MG tablet Take 1 tablet (40 mg total) by mouth daily. 30 tablet 0   simethicone (MYLICON) 80 MG chewable tablet Chew 80 mg by mouth every 6 (six) hours as needed for flatulence.      REVIEW OF SYSTEMS:  [X]  denotes positive finding, [ ]  denotes negative finding Cardiac   Comments:  Chest pain or chest pressure:    Shortness of breath upon exertion:    Short of breath when lying flat:    Irregular heart rhythm:        Vascular    Pain in calf, thigh, or hip brought on by ambulation: x   Pain in feet at night that wakes you up from your sleep:  x   Blood clot in your veins:    Leg swelling:         Pulmonary  Oxygen at home:    Productive cough:     Wheezing:         Neurologic    Sudden weakness in arms or legs:     Sudden numbness in arms or legs:  x   Sudden onset of difficulty speaking or slurred speech:    Temporary loss of vision in one eye:     Problems with dizziness:         Gastrointestinal    Blood in stool:     Vomited blood:         Genitourinary    Burning when urinating:     Blood in urine:        Psychiatric    Major depression:         Hematologic    Bleeding problems:    Problems with blood clotting too easily:        Skin    Rashes or ulcers:        Constitutional    Fever or chills:    -  PHYSICAL EXAM:   Vitals:   09/07/22 2000 09/07/22 2200 09/07/22 2345 09/07/22 2349  BP: (!) 137/90 129/81 (!) 138/91   Pulse: (!) 104 (!) 115 (!) 107   Resp: 19 20 (!) 21   Temp:    98.4 F (36.9 C)  TempSrc:    Oral  SpO2: 93% 93% 93%   Weight:      Height:       Body mass index is 34.87 kg/m. GENERAL: The patient is a well-nourished male, in no acute distress. The vital signs are documented above. CARDIAC: There is a regular rate and rhythm.  VASCULAR: I do not detect carotid bruits. He has a palpable right femoral pulse.  He has a palpable right dorsalis pedis pulse. I cannot palpate a left femoral pulse. He has a dampened monophasic dorsalis pedis signal with a Doppler. PULMONARY: There is good air exchange bilaterally without wheezing or rales. ABDOMEN: Soft and non-tender with normal pitched bowel sounds.  MUSCULOSKELETAL: There are no major deformities. NEUROLOGIC: He has some paresthesias in the left  foot but does have intact sensation and motor function. SKIN: There are no ulcers or rashes noted. PSYCHIATRIC: The patient has a normal affect.  DATA:    CT ANGIO: I reviewed the images of his CT angiogram.  This shows occlusion of the left femoral artery and also of the popliteal artery and tibial peroneal trunk.  LABS: His potassium is 3.9.  Creatinine 1.59.  GFR 47.  Hemoglobin 16.1.  Platelets 165,000.  Waverly Ferrari [facility]

## 2022-09-07 NOTE — Progress Notes (Signed)
ANTICOAGULATION CONSULT NOTE - Initial Consult  Pharmacy Consult for Heparin Indication:  Femoral artery occlusion  Allergies  Allergen Reactions   Iohexol Itching    Does well with pre-medication    Ivp Dye [Iodinated Contrast Media] Itching    Does well with pre-medication   Morphine Other (See Comments)    Delerium   Otezla [Apremilast] Diarrhea and Nausea And Vomiting   Plaquenil [Hydroxychloroquine]     Patient Measurements: Height: 5\' 11"  (180.3 cm) Weight: 113.4 kg (250 lb) IBW/kg (Calculated) : 75.3 Heparin Dosing Weight: 99.9kg  Vital Signs: Temp: 98.1 F (36.7 C) (06/22 1444) Temp Source: Oral (06/22 1444) BP: 137/90 (06/22 2000) Pulse Rate: 104 (06/22 2000)  Labs: Recent Labs    09/07/22 1612  HGB 16.1  HCT 48.5  PLT 165  CREATININE 1.59*    Estimated Creatinine Clearance: 56.9 mL/min (A) (by C-G formula based on SCr of 1.59 mg/dL (H)).   Medical History: Past Medical History:  Diagnosis Date   Cancer [facility]) 2008   Right lung   Hyperlipidemia    Renal disorder     Medications:  (Not in a hospital admission)  Scheduled:  Infusions:   sodium chloride     PRN:   Assessment: 17 yom with a history of AAA status post stenting . Patient is presenting with left leg pain. Heparin per pharmacy consult placed for  Femoral artery occlusion .  Patient is not on anticoagulation prior to arrival.  Hgb 16.1; plt 165  Goal of Therapy:  Heparin level 0.3-0.7 units/ml Monitor platelets by anticoagulation protocol: Yes   Plan:  Give IV heparin 5500 units bolus x 1 Start heparin infusion at 1700 units/hr Check anti-Xa level in 8 hours and daily while on heparin Continue to monitor H&H and platelets  Delmar Landau, PharmD, BCPS 09/07/2022 9:15 PM ED Clinical Pharmacist -  [phone]

## 2022-09-07 NOTE — ED Triage Notes (Addendum)
Pt via POV c/o left lower leg pain and numbness since last Thursday. He was doing yard work and felt a pull and a pop. Went to [facility]; workup showed no DVT and no fracture. Pt was discharged with rx for muscle relaxants and stretches. Pt has run out of muscle relaxer and says symptoms have not resolved. Called PCP and was advised to come for CT scan. Ambulatory to triage.

## 2022-09-07 NOTE — ED Provider Notes (Addendum)
[place] [facility] AT [facility] Provider Note   CSN: [id number] Arrival date & time: 09/07/22  1415     History  Chief Complaint  Patient presents with   Leg Injury    Troy Warner is a 67 y.o. male.  Patient is a 67 year old male with a past medical history of AAA status post stenting presenting to the emergency department with left leg pain.  The patient states about 2 weeks ago he was pulling a pool cover off and felt a severe pain in his left calf.  He states that he initially felt like he may have pulled a muscle and was seen at an outside ED where he had a negative DVT study and was given muscle relaxer for treatment.  He states that despite taking this his pain is increased.  He states that he is also having numbness in his leg and feels pins-and-needles on the bottom of his foot and on his anterior of his shin.  He states that the pain is worse with ambulating.  He denies any swelling to his leg.  He denies any associated back pain.  He denies any weakness.  The history is provided by the patient and the spouse.       Home Medications Prior to Admission medications   Medication Sig Start Date End Date Taking? Authorizing Provider  aspirin EC 81 MG tablet Take 81 mg by mouth every evening. Swallow whole.    [provider]  chlorpheniramine (CHLOR-TRIMETON) 4 MG tablet Take 4 mg by mouth 2 (two) times daily as needed for allergies.    [provider]  clopidogrel (PLAVIX) 75 MG tablet Take 75 mg by mouth every evening. 07/04/21   [provider]  docusate sodium (COLACE) 100 MG capsule Take 100 mg by mouth at bedtime.    [provider]  famotidine (PEPCID AC) 10 MG tablet Take 10 mg by mouth daily as needed for heartburn or indigestion.    [provider]  HYDROcodone-acetaminophen (NORCO) 10-325 MG tablet Take 1 tablet by mouth 3 (three) times daily as needed for moderate pain or severe pain.  08/13/21   [provider]  LORazepam (ATIVAN) 1 MG tablet Take 1 mg by mouth daily as needed for anxiety. 07/12/21   [provider]  nicotine polacrilex (COMMIT) 4 MG lozenge Take 4 mg by mouth daily as needed for smoking cessation.    [provider]  pantoprazole (PROTONIX) 40 MG tablet Take 1 tablet (40 mg total) by mouth daily. 09/07/21   Dione Booze, MD  simethicone (MYLICON) 80 MG chewable tablet Chew 80 mg by mouth every 6 (six) hours as needed for flatulence.    [provider]      Allergies    Iohexol, Ivp dye [iodinated contrast media], Morphine, Otezla [apremilast], and Plaquenil [hydroxychloroquine]    Review of Systems   Review of Systems  Physical Exam Updated Vital Signs BP (!) 137/90   Pulse (!) 104   Temp 98.1 F (36.7 C) (Oral)   Resp 19   Ht 5\' 11"  (1.803 m)   Wt 113.4 kg   SpO2 93%   BMI 34.87 kg/m  Physical Exam Vitals and nursing note reviewed.  Constitutional:      General: He is not in acute distress.    Appearance: Normal appearance.  HENT:     Head: Normocephalic and atraumatic.     Nose: Nose normal.     Mouth/Throat:  Mouth: Mucous membranes are moist.  Eyes:     Extraocular Movements: Extraocular movements intact.  Cardiovascular:     Rate and Rhythm: Normal rate.     Comments: 2+ DP pulses on R foot, 1+ DP pulse on L foot Pulmonary:     Effort: Pulmonary effort is normal.  Abdominal:     General: Abdomen is flat.  Musculoskeletal:        General: No swelling or deformity. Normal range of motion.     Cervical back: Normal range of motion.     Right lower leg: No edema.     Left lower leg: No edema.     Comments: No bony tenderness to palpation of bilateral LE Mild L calf tenderness, no overlying swelling  Skin:    General: Skin is warm and dry.     Comments: Bilateral feet warm to touch  Neurological:     Mental Status: He is alert and oriented to person, place, and time.     Comments:  Subjectively decreased sensation of L foot and anterior shin, remainder of sensation intact in LLE Strength 5/5 in plantar/dorsiflexion, knee extension, hip flexion of bilateral LE  Psychiatric:        Mood and Affect: Mood normal.        Behavior: Behavior normal.     ED Results / Procedures / Treatments   Labs (all labs ordered are listed, but only abnormal results are displayed) Labs Reviewed  BASIC METABOLIC PANEL - Abnormal; Notable for the following components:      Result Value   Creatinine, Ser 1.59 (*)    GFR, Estimated 47 (*)    All other components within normal limits  CBC WITH DIFFERENTIAL/PLATELET  HEPARIN LEVEL (UNFRACTIONATED)    EKG EKG Interpretation  Date/Time:  Saturday September 07 2022 16:03:42 EDT Ventricular Rate:  83 PR Interval:  197 QRS Duration: 93 QT Interval:  357 QTC Calculation: 420 R Axis:   -8 Text Interpretation: Sinus rhythm Abnormal R-wave progression, early transition No significant change since last tracing Confirmed by Elayne Snare ([id number]) on 09/07/2022 4:05:05 PM  Radiology CT ANGIO LOWER EXT BILAT W &/OR WO CONTRAST  Result Date: 09/07/2022 CLINICAL DATA:  Left lower leg pain and numbness since Thursday EXAM: CT ANGIOGRAPHY OF ABDOMINAL AORTA WITH ILIOFEMORAL RUNOFF TECHNIQUE: Multidetector CT imaging of the abdomen, pelvis and lower extremities was performed using the standard protocol during bolus administration of intravenous contrast. Multiplanar CT image reconstructions and MIPs were obtained to evaluate the vascular anatomy. RADIATION DOSE REDUCTION: This exam was performed according to the departmental dose-optimization program which includes automated exposure control, adjustment of the mA and/or kV according to patient size and/or use of iterative reconstruction technique. CONTRAST:  OMNIPAQUE IOHEXOL 350 MG/ML SOLN COMPARISON:  08/30/2022, 09/07/2021 FINDINGS: VASCULAR Aorta: Distal margin of endoluminal stent graft is seen  within a chronic abdominal aortic aneurysm. The excluded visualized portion of the aortic aneurysm is thrombosed. The iliac limbs of the endoluminal stent graft are widely patent. RIGHT Lower Extremity Inflow: Patent endoluminal stent graft through the right common iliac artery. The right external and internal iliac arteries are widely patent. Outflow: Common, superficial and profunda femoral arteries and the popliteal artery are patent without evidence of aneurysm, dissection, vasculitis or significant stenosis. Runoff: There is irregular plaque or thrombus within the distal popliteal artery just proximal to the origin of the anterior tibial artery, with estimated 50-70% stenosis. Anterior tibial artery is widely patent throughout its course. There is  plaque or thrombus at the level the bifurcation of the tibioperoneal trunk, with estimated 50% stenosis. The peroneal and posterior tibial arteries are widely patent through the level of the ankle. LEFT Lower Extremity Inflow: Patent limb of the endoluminal stent graft within the common iliac artery. The external and internal iliac arteries are widely patent at their origins. Outflow: There is complete occlusion of the left common femoral artery from the level of the inguinal ligament through the bifurcation. Proximal aspects of the left profundus femoral and superficial femoral arteries are occluded, with immediate reconstitution via collateral flow. Remaining portions of the left SFA and profundus femoral artery are widely patent. Runoff: Filling defect is seen within the distal left popliteal artery, extending into the tibioperoneal trunk and origin of the left anterior tibial artery. This may reflect a distal embolus, with greater than 50% stenosis. The trifurcation vessels are opacified through the level of the ankle. Veins: No obvious venous abnormality within the limitations of this arterial phase study. Review of the MIP images confirms the above findings.  NON-VASCULAR Adrenals/Urinary Tract: Visualized portion of the left kidney is unremarkable. Normal excretion of contrast in the left ureter. Bladder is unremarkable. Stomach/Bowel: No bowel obstruction or ileus. Scattered colonic diverticulosis without diverticulitis. Lymphatic: No pathologic adenopathy. Reproductive: Stable mild enlargement of the prostate. Other: No free fluid or free intraperitoneal gas. Fat containing umbilical and right inguinal hernias are again noted. No bowel herniation. Musculoskeletal: No acute or destructive bony abnormalities. Reconstructed images demonstrate no additional findings. IMPRESSION: VASCULAR 1. 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 There is immediate reconstitution of the left SFA and profundus femoral arteries via collateral flow. It is unclear whether this occlusion is as result of insitu thrombus or thrombosed dissection. There is no significant atheromatous plaque on prior study 09/07/2021, but note is made that this is the location of prior cutdown for catheterization for endoluminal stent graft placement. 2. Distal emboli within the left popliteal artery and tibioperoneal trunk, with the proximally 50% stenosis. Trifurcation vessels are opacified through the level of the ankle however. 3. Irregular filling defects within the distal right popliteal artery and tibioperoneal trunk, favor atheromatous plaque over distal emboli. 4. Prior endoluminal stent graft repair of an abdominal aortic aneurysm, incompletely visualized on this study. NON-VASCULAR 1. Distal colonic diverticulosis without diverticulitis. 2. Fat containing umbilical and right inguinal hernias. Critical Value/emergent results were called by telephone at the time of interpretation on 09/07/2022 at 9:02 pm to provider [facility] , who verbally acknowledged these results. Electronically Signed   By: Sharlet Salina M.D.   On: 09/07/2022 21:09     Procedures .Critical Care  Performed by: Rexford Maus, DO Authorized by: Rexford Maus, DO   Critical care provider statement:    Critical care time (minutes):  40   Critical care time was exclusive of:  Separately billable procedures and treating other patients   Critical care was necessary to treat or prevent imminent or life-threatening deterioration of the following conditions:  Circulatory failure   Critical care was time spent personally by me on the following activities:  Development of treatment plan with patient or surrogate, discussions with consultants, evaluation of patient's response to treatment, examination of patient, obtaining history from patient or surrogate, ordering and performing treatments and interventions, ordering and review of laboratory studies, ordering and review of radiographic studies, pulse oximetry, re-evaluation of patient's condition and review of old charts   I assumed direction  of critical care for this patient from another provider in my specialty: no     Care discussed with: admitting provider       Medications Ordered in ED Medications  sodium chloride 0.9 % bolus 1,000 mL (has no administration in time range)  heparin ADULT infusion 100 units/mL (25000 units/273mL) (has no administration in time range)  heparin bolus via infusion 5,500 Units (has no administration in time range)  methylPREDNISolone sodium succinate (SOLU-MEDROL) 40 mg/mL injection 40 mg (40 mg Intravenous Given 09/07/22 1610)  diphenhydrAMINE (BENADRYL) capsule 50 mg ( Oral See Alternative 09/07/22 1904)    Or  diphenhydrAMINE (BENADRYL) injection 50 mg (50 mg Intravenous Given 09/07/22 1904)  iohexol (OMNIPAQUE) 350 MG/ML injection 100 mL (100 mLs Intravenous Contrast Given 09/07/22 2048)    ED Course/ Medical Decision Making/ A&P Clinical Course as of 09/07/22 2123  Sat Sep 07, 2022  2110 I received a call from radiology that the patient does have an occlusion  of the left femoral artery with collateral in the foot.  Vascular will be consulted. [VK]  2113 I spoke with Dr. Edilia Bo of vascular who recommended heparin drip and ED to ED transfer to [facility] for evaluation. [VK]  2119 Patient accepted by Dr. Suezanne Jacquet for ED to ED transfer at [facility]. [VK]    Clinical Course User Index [VK] Rexford Maus, DO                             Medical Decision Making This patient presents to the ED with chief complaint(s) of LLE pain/parethesias with pertinent past medical history of AAA s/p repair which further complicates the presenting complaint. The complaint involves an extensive differential diagnosis and also carries with it a high risk of complications and morbidity.    The differential diagnosis includes ischemic limb, LE arterial dissection, muscle strain/spasm, fracture/dislocation unlikely as no point bony tenderness, no back pain making sciatica/radiculopathy less likely    Additional history obtained: Additional history obtained from spouse Records reviewed Care Everywhere/External Records - outpatient ED records  ED Course and Reassessment: On patient's arrival to the emergency department he is hemodynamically stable in no acute distress.  He does have diminished pulses in the left leg compared to the right concerning for possible arterial injury and will have CT angio of his lower extremities performed.  He does have a contrast dye allergy and will be given prep prior to imaging.  He declined any pain medication at this time and will be closely reassessed.  Independent labs interpretation:  The following labs were independently interpreted: mild Cr elevation from baseline, otherwise within normal range   Independent visualization of imaging: - I independently visualized the following imaging with scope of interpretation limited to determining acute life threatening conditions related to emergency care: CTA LE, which revealed L femoral artery  occlusion  Consultation: - Consulted or discussed management/test interpretation w/ external professional: vascular  Consideration for admission or further workup: patient requires transfer to [facility] ED for vascular evaluation Social Determinants of health: N/A    Amount and/or Complexity of Data Reviewed Labs: ordered. Radiology: ordered.  Risk Prescription drug management.          Final Clinical Impression(s) / ED Diagnoses Final diagnoses:  Femoral artery occlusion, left [facility])    Rx / DC Orders ED Discharge Orders     None         Rexford Maus, DO 09/07/22 2123  Rexford Maus, DO 09/07/22 2124

## 2022-09-07 NOTE — ED Notes (Signed)
Patient transported to CT 

## 2022-09-07 NOTE — ED Notes (Signed)
[facility] called for transport. 

## 2022-09-08 ENCOUNTER — Encounter (HOSPITAL_COMMUNITY)
Admission: EM | Disposition: A | Discharge status: Home / Self Care | Payer: Self-pay | Source: Home / Self Care | Attending: Vascular Surgery

## 2022-09-08 ENCOUNTER — Inpatient Hospital Stay (HOSPITAL_COMMUNITY): Payer: Medicare PPO

## 2022-09-08 ENCOUNTER — Inpatient Hospital Stay (HOSPITAL_COMMUNITY): Payer: Medicare PPO | Admitting: Anesthesiology

## 2022-09-08 ENCOUNTER — Other Ambulatory Visit: Payer: Self-pay

## 2022-09-08 ENCOUNTER — Encounter (HOSPITAL_COMMUNITY): Payer: Self-pay | Admitting: Vascular Surgery

## 2022-09-08 DIAGNOSIS — M62262 Nontraumatic ischemic infarction of muscle, left lower leg: Secondary | ICD-10-CM

## 2022-09-08 DIAGNOSIS — Z87891 Personal history of nicotine dependence: Secondary | ICD-10-CM

## 2022-09-08 DIAGNOSIS — I743 Embolism and thrombosis of arteries of the lower extremities: Secondary | ICD-10-CM | POA: Diagnosis not present

## 2022-09-08 DIAGNOSIS — I509 Heart failure, unspecified: Secondary | ICD-10-CM | POA: Diagnosis not present

## 2022-09-08 DIAGNOSIS — N289 Disorder of kidney and ureter, unspecified: Secondary | ICD-10-CM

## 2022-09-08 DIAGNOSIS — I998 Other disorder of circulatory system: Secondary | ICD-10-CM | POA: Diagnosis not present

## 2022-09-08 DIAGNOSIS — I739 Peripheral vascular disease, unspecified: Secondary | ICD-10-CM

## 2022-09-08 HISTORY — PX: PATCH ANGIOPLASTY: SHX6230

## 2022-09-08 HISTORY — PX: VEIN HARVEST: SHX6363

## 2022-09-08 HISTORY — PX: FEMORAL-POPLITEAL BYPASS GRAFT: SHX937

## 2022-09-08 HISTORY — PX: APPLICATION OF WOUND VAC: SHX5189

## 2022-09-08 LAB — HIV ANTIBODY (ROUTINE TESTING W REFLEX): HIV Screen 4th Generation wRfx: NONREACTIVE

## 2022-09-08 LAB — TYPE AND SCREEN
ABO/RH(D): B POS
Antibody Screen: NEGATIVE

## 2022-09-08 LAB — ECHOCARDIOGRAM COMPLETE
AR max vel: 3.22 cm2
AV Area VTI: 3.29 cm2
AV Area mean vel: 3.47 cm2
AV Mean grad: 6.5 mmHg
AV Peak grad: 12.7 mmHg
Ao pk vel: 1.78 m/s
Area-P 1/2: 3.08 cm2
Calc EF: 67.5 %
Height: 70.984 in
MV VTI: 4.75 cm2
S' Lateral: 2.9 cm
Single Plane A2C EF: 67.7 %
Single Plane A4C EF: 67.3 %
Weight: 3858.93 oz

## 2022-09-08 LAB — SURGICAL PCR SCREEN
MRSA, PCR: NEGATIVE
Staphylococcus aureus: NEGATIVE

## 2022-09-08 LAB — ABO/RH: ABO/RH(D): B POS

## 2022-09-08 SURGERY — BYPASS GRAFT FEMORAL-POPLITEAL ARTERY
Anesthesia: General | Site: Leg Upper | Laterality: Left

## 2022-09-08 MED ORDER — FENTANYL CITRATE (PF) 100 MCG/2ML IJ SOLN
25.0000 ug | INTRAMUSCULAR | Status: DC | PRN
  Administered 2022-09-08 (×4): 50 ug via INTRAVENOUS
  Administered 2022-09-08: 150 ug via INTRAVENOUS

## 2022-09-08 MED ORDER — LIDOCAINE 2% (20 MG/ML) 5 ML SYRINGE
INTRAMUSCULAR | Status: AC
  Filled 2022-09-08: qty 5

## 2022-09-08 MED ORDER — MIDAZOLAM HCL 2 MG/2ML IJ SOLN
INTRAMUSCULAR | Status: AC
  Filled 2022-09-08: qty 2

## 2022-09-08 MED ORDER — IPRATROPIUM-ALBUTEROL 0.5-2.5 (3) MG/3ML IN SOLN
RESPIRATORY_TRACT | Status: AC
  Filled 2022-09-08: qty 3

## 2022-09-08 MED ORDER — SODIUM CHLORIDE 0.9 % IV SOLN: 500.0000 mL | Freq: Once | INTRAVENOUS | Status: DC | PRN

## 2022-09-08 MED ORDER — SIMETHICONE 80 MG PO CHEW: 80.0000 mg | CHEWABLE_TABLET | Freq: Four times a day (QID) | ORAL | Status: DC | PRN

## 2022-09-08 MED ORDER — PROTAMINE SULFATE 10 MG/ML IV SOLN
INTRAVENOUS | Status: AC
  Filled 2022-09-08: qty 10

## 2022-09-08 MED ORDER — OXYCODONE HCL 5 MG PO TABS
5.0000 mg | ORAL_TABLET | ORAL | Status: DC | PRN
  Administered 2022-09-08 – 2022-09-09 (×3): 10 mg via ORAL
  Filled 2022-09-08 (×3): qty 2

## 2022-09-08 MED ORDER — PANTOPRAZOLE SODIUM 40 MG PO TBEC: 40.0000 mg | DELAYED_RELEASE_TABLET | Freq: Every day | ORAL | Status: DC

## 2022-09-08 MED ORDER — OXYCODONE HCL 5 MG/5ML PO SOLN
5.0000 mg | Freq: Once | ORAL | Status: AC | PRN
  Administered 2022-09-08: 5 mg via ORAL

## 2022-09-08 MED ORDER — BISACODYL 10 MG RE SUPP: 10.0000 mg | Freq: Every day | RECTAL | Status: DC | PRN

## 2022-09-08 MED ORDER — CLOPIDOGREL BISULFATE 75 MG PO TABS
75.0000 mg | ORAL_TABLET | Freq: Every evening | ORAL | Status: DC
  Administered 2022-09-08: 75 mg via ORAL
  Filled 2022-09-08: qty 1

## 2022-09-08 MED ORDER — ONDANSETRON HCL 4 MG/2ML IJ SOLN
INTRAMUSCULAR | Status: DC | PRN
  Administered 2022-09-08: 4 mg via INTRAVENOUS

## 2022-09-08 MED ORDER — POTASSIUM CHLORIDE CRYS ER 20 MEQ PO TBCR: 20.0000 meq | EXTENDED_RELEASE_TABLET | Freq: Every day | ORAL | Status: DC | PRN

## 2022-09-08 MED ORDER — METOPROLOL TARTRATE 5 MG/5ML IV SOLN: 2.0000 mg | INTRAVENOUS | Status: DC | PRN

## 2022-09-08 MED ORDER — FAMOTIDINE 20 MG PO TABS: 10.0000 mg | ORAL_TABLET | Freq: Every day | ORAL | Status: DC | PRN

## 2022-09-08 MED ORDER — HEMOSTATIC AGENTS (NO CHARGE) OPTIME
TOPICAL | Status: DC | PRN
  Administered 2022-09-08: 1 via TOPICAL

## 2022-09-08 MED ORDER — ROSUVASTATIN CALCIUM 5 MG PO TABS
10.0000 mg | ORAL_TABLET | Freq: Every day | ORAL | Status: DC
  Administered 2022-09-08: 10 mg via ORAL
  Filled 2022-09-08: qty 2

## 2022-09-08 MED ORDER — LIDOCAINE 2% (20 MG/ML) 5 ML SYRINGE
INTRAMUSCULAR | Status: DC | PRN
  Administered 2022-09-08: 80 mg via INTRAVENOUS

## 2022-09-08 MED ORDER — HYDROCODONE-ACETAMINOPHEN 10-325 MG PO TABS: 1.0000 | ORAL_TABLET | Freq: Three times a day (TID) | ORAL | Status: DC | PRN

## 2022-09-08 MED ORDER — PROPOFOL 10 MG/ML IV BOLUS
INTRAVENOUS | Status: AC
  Filled 2022-09-08: qty 20

## 2022-09-08 MED ORDER — PAPAVERINE HCL 30 MG/ML IJ SOLN
INTRAMUSCULAR | Status: AC
  Filled 2022-09-08: qty 2

## 2022-09-08 MED ORDER — PHENYLEPHRINE 80 MCG/ML (10ML) SYRINGE FOR IV PUSH (FOR BLOOD PRESSURE SUPPORT)
PREFILLED_SYRINGE | INTRAVENOUS | Status: AC
  Filled 2022-09-08: qty 10

## 2022-09-08 MED ORDER — HYDRALAZINE HCL 20 MG/ML IJ SOLN: 5.0000 mg | INTRAMUSCULAR | Status: DC | PRN

## 2022-09-08 MED ORDER — PAPAVERINE HCL 30 MG/ML IJ SOLN
INTRAMUSCULAR | Status: DC | PRN
  Administered 2022-09-08: 60 mg via INTRAVENOUS

## 2022-09-08 MED ORDER — DIPHENHYDRAMINE HCL 25 MG PO CAPS
25.0000 mg | ORAL_CAPSULE | Freq: Four times a day (QID) | ORAL | Status: DC
  Administered 2022-09-08 – 2022-09-09 (×4): 25 mg via ORAL
  Filled 2022-09-08 (×7): qty 1

## 2022-09-08 MED ORDER — DEXAMETHASONE SODIUM PHOSPHATE 10 MG/ML IJ SOLN
INTRAMUSCULAR | Status: AC
  Filled 2022-09-08: qty 1

## 2022-09-08 MED ORDER — PROPOFOL 10 MG/ML IV BOLUS
INTRAVENOUS | Status: DC | PRN
  Administered 2022-09-08: 110 mg via INTRAVENOUS

## 2022-09-08 MED ORDER — HEPARIN (PORCINE) 25000 UT/250ML-% IV SOLN
1800.0000 [IU]/h | INTRAVENOUS | Status: DC
  Administered 2022-09-08: 1700 [IU]/h via INTRAVENOUS
  Administered 2022-09-09: 1800 [IU]/h via INTRAVENOUS
  Filled 2022-09-08 (×2): qty 250

## 2022-09-08 MED ORDER — ORAL CARE MOUTH RINSE: 15.0000 mL | Freq: Once | OROMUCOSAL | Status: AC

## 2022-09-08 MED ORDER — LORAZEPAM 1 MG PO TABS: 1.0000 mg | ORAL_TABLET | Freq: Every day | ORAL | Status: DC | PRN

## 2022-09-08 MED ORDER — OXYCODONE HCL 5 MG/5ML PO SOLN
ORAL | Status: AC
  Filled 2022-09-08: qty 5

## 2022-09-08 MED ORDER — ROCURONIUM BROMIDE 10 MG/ML (PF) SYRINGE
PREFILLED_SYRINGE | INTRAVENOUS | Status: DC | PRN
  Administered 2022-09-08: 20 mg via INTRAVENOUS
  Administered 2022-09-08: 10 mg via INTRAVENOUS
  Administered 2022-09-08: 80 mg via INTRAVENOUS

## 2022-09-08 MED ORDER — ACETAMINOPHEN 10 MG/ML IV SOLN
INTRAVENOUS | Status: AC
  Filled 2022-09-08: qty 100

## 2022-09-08 MED ORDER — PANTOPRAZOLE SODIUM 40 MG PO TBEC
40.0000 mg | DELAYED_RELEASE_TABLET | Freq: Every day | ORAL | Status: DC
  Administered 2022-09-08 – 2022-09-09 (×2): 40 mg via ORAL
  Filled 2022-09-08 (×2): qty 1

## 2022-09-08 MED ORDER — FENTANYL CITRATE (PF) 250 MCG/5ML IJ SOLN
INTRAMUSCULAR | Status: AC
  Filled 2022-09-08: qty 5

## 2022-09-08 MED ORDER — ONDANSETRON HCL 4 MG/2ML IJ SOLN
INTRAMUSCULAR | Status: AC
  Filled 2022-09-08: qty 2

## 2022-09-08 MED ORDER — DEXTROSE-SODIUM CHLORIDE 5-0.9 % IV SOLN: INTRAVENOUS | Status: DC

## 2022-09-08 MED ORDER — ROCURONIUM BROMIDE 10 MG/ML (PF) SYRINGE
PREFILLED_SYRINGE | INTRAVENOUS | Status: AC
  Filled 2022-09-08: qty 20

## 2022-09-08 MED ORDER — CEFAZOLIN SODIUM-DEXTROSE 2-4 GM/100ML-% IV SOLN
INTRAVENOUS | Status: AC
  Filled 2022-09-08: qty 100

## 2022-09-08 MED ORDER — PROTAMINE SULFATE 10 MG/ML IV SOLN
INTRAVENOUS | Status: DC | PRN
  Administered 2022-09-08 (×4): 10 mg via INTRAVENOUS

## 2022-09-08 MED ORDER — HEPARIN SODIUM (PORCINE) 1000 UNIT/ML IJ SOLN
INTRAMUSCULAR | Status: DC | PRN
  Administered 2022-09-08: 11000 [IU] via INTRAVENOUS

## 2022-09-08 MED ORDER — HEPARIN 6000 UNIT IRRIGATION SOLUTION
Status: AC
  Filled 2022-09-08: qty 500

## 2022-09-08 MED ORDER — GUAIFENESIN-DM 100-10 MG/5ML PO SYRP: 15.0000 mL | ORAL_SOLUTION | ORAL | Status: DC | PRN

## 2022-09-08 MED ORDER — ACETAMINOPHEN 160 MG/5ML PO SOLN: 1000.0000 mg | Freq: Once | ORAL | Status: DC | PRN

## 2022-09-08 MED ORDER — MUPIROCIN 2 % EX OINT: 1.0000 | TOPICAL_OINTMENT | Freq: Once | CUTANEOUS | Status: AC

## 2022-09-08 MED ORDER — DOCUSATE SODIUM 100 MG PO CAPS
100.0000 mg | ORAL_CAPSULE | Freq: Every day | ORAL | Status: DC
  Administered 2022-09-09: 100 mg via ORAL
  Filled 2022-09-08: qty 1

## 2022-09-08 MED ORDER — LACTATED RINGERS IV SOLN: INTRAVENOUS | Status: DC

## 2022-09-08 MED ORDER — ACETAMINOPHEN 650 MG RE SUPP: 325.0000 mg | RECTAL | Status: DC | PRN

## 2022-09-08 MED ORDER — ONDANSETRON HCL 4 MG/2ML IJ SOLN: 4.0000 mg | Freq: Four times a day (QID) | INTRAMUSCULAR | Status: DC | PRN

## 2022-09-08 MED ORDER — MUPIROCIN 2 % EX OINT
TOPICAL_OINTMENT | CUTANEOUS | Status: AC
  Administered 2022-09-08: 1 via TOPICAL
  Filled 2022-09-08: qty 22

## 2022-09-08 MED ORDER — PHENYLEPHRINE HCL-NACL 20-0.9 MG/250ML-% IV SOLN
INTRAVENOUS | Status: DC | PRN
  Administered 2022-09-08: 25 ug/min via INTRAVENOUS

## 2022-09-08 MED ORDER — HEPARIN 6000 UNIT IRRIGATION SOLUTION
Status: DC | PRN
  Administered 2022-09-08: 1

## 2022-09-08 MED ORDER — 0.9 % SODIUM CHLORIDE (POUR BTL) OPTIME
TOPICAL | Status: DC | PRN
  Administered 2022-09-08: 2000 mL

## 2022-09-08 MED ORDER — CEFAZOLIN SODIUM-DEXTROSE 2-4 GM/100ML-% IV SOLN
2.0000 g | Freq: Three times a day (TID) | INTRAVENOUS | Status: AC
  Administered 2022-09-08 (×2): 2 g via INTRAVENOUS
  Filled 2022-09-08 (×2): qty 100

## 2022-09-08 MED ORDER — ACETAMINOPHEN 325 MG PO TABS
325.0000 mg | ORAL_TABLET | ORAL | Status: DC | PRN
  Filled 2022-09-08: qty 2

## 2022-09-08 MED ORDER — OXYCODONE HCL 5 MG PO TABS: 5.0000 mg | ORAL_TABLET | Freq: Once | ORAL | Status: AC | PRN

## 2022-09-08 MED ORDER — POLYETHYLENE GLYCOL 3350 17 G PO PACK: 17.0000 g | PACK | Freq: Every day | ORAL | Status: DC | PRN

## 2022-09-08 MED ORDER — PHENOL 1.4 % MT LIQD: 1.0000 | OROMUCOSAL | Status: DC | PRN

## 2022-09-08 MED ORDER — IPRATROPIUM-ALBUTEROL 0.5-2.5 (3) MG/3ML IN SOLN
3.0000 mL | RESPIRATORY_TRACT | Status: DC
  Administered 2022-09-08: 3 mL via RESPIRATORY_TRACT

## 2022-09-08 MED ORDER — CHLORHEXIDINE GLUCONATE 0.12 % MT SOLN
OROMUCOSAL | Status: AC
  Administered 2022-09-08: 15 mL via OROMUCOSAL
  Filled 2022-09-08: qty 15

## 2022-09-08 MED ORDER — ROCURONIUM BROMIDE 10 MG/ML (PF) SYRINGE
PREFILLED_SYRINGE | INTRAVENOUS | Status: AC
  Filled 2022-09-08: qty 10

## 2022-09-08 MED ORDER — ACETAMINOPHEN 10 MG/ML IV SOLN
1000.0000 mg | Freq: Once | INTRAVENOUS | Status: DC | PRN
  Administered 2022-09-08: 1000 mg via INTRAVENOUS

## 2022-09-08 MED ORDER — ACETAMINOPHEN 500 MG PO TABS: 1000.0000 mg | ORAL_TABLET | Freq: Once | ORAL | Status: DC | PRN

## 2022-09-08 MED ORDER — ASPIRIN 81 MG PO TBEC
81.0000 mg | DELAYED_RELEASE_TABLET | Freq: Every evening | ORAL | Status: DC
  Administered 2022-09-08: 81 mg via ORAL
  Filled 2022-09-08: qty 1

## 2022-09-08 MED ORDER — DOCUSATE SODIUM 100 MG PO CAPS
100.0000 mg | ORAL_CAPSULE | Freq: Every day | ORAL | Status: DC
  Administered 2022-09-08: 100 mg via ORAL
  Filled 2022-09-08: qty 1

## 2022-09-08 MED ORDER — CHLORHEXIDINE GLUCONATE 0.12 % MT SOLN: 15.0000 mL | Freq: Once | OROMUCOSAL | Status: AC

## 2022-09-08 MED ORDER — MAGNESIUM SULFATE 2 GM/50ML IV SOLN: 2.0000 g | Freq: Every day | INTRAVENOUS | Status: DC | PRN

## 2022-09-08 MED ORDER — NICOTINE POLACRILEX 4 MG MT LOZG: 4.0000 mg | LOZENGE | Freq: Every day | OROMUCOSAL | Status: DC | PRN

## 2022-09-08 MED ORDER — SUGAMMADEX SODIUM 200 MG/2ML IV SOLN
INTRAVENOUS | Status: DC | PRN
  Administered 2022-09-08: 200 mg via INTRAVENOUS

## 2022-09-08 MED ORDER — LABETALOL HCL 5 MG/ML IV SOLN: 10.0000 mg | INTRAVENOUS | Status: DC | PRN

## 2022-09-08 MED ORDER — HEPARIN SODIUM (PORCINE) 1000 UNIT/ML IJ SOLN
INTRAMUSCULAR | Status: AC
  Filled 2022-09-08: qty 20

## 2022-09-08 MED ORDER — LACTATED RINGERS IV SOLN: INTRAVENOUS | Status: DC | PRN

## 2022-09-08 MED ORDER — FENTANYL CITRATE (PF) 100 MCG/2ML IJ SOLN
INTRAMUSCULAR | Status: AC
  Filled 2022-09-08: qty 2

## 2022-09-08 SURGICAL SUPPLY — 68 items
ADH SKN CLS APL DERMABOND .7 (GAUZE/BANDAGES/DRESSINGS) ×2
AGENT HMST 10 BLLW SHRT CANN (HEMOSTASIS) ×2
BAG COUNTER SPONGE SURGICOUNT (BAG) ×2 IMPLANT
BAG SPNG CNTER NS LX DISP (BAG) ×2
BANDAGE ESMARK 6X9 LF (GAUZE/BANDAGES/DRESSINGS) IMPLANT
BNDG CMPR 9X6 STRL LF SNTH (GAUZE/BANDAGES/DRESSINGS)
BNDG ESMARK 6X9 LF (GAUZE/BANDAGES/DRESSINGS)
CANISTER SUCT 3000ML PPV (MISCELLANEOUS) ×2 IMPLANT
CANNULA VESSEL 3MM 2 BLNT TIP (CANNULA) ×4 IMPLANT
CATH EMB 3FR 80 (CATHETERS) IMPLANT
CATH EMB 4FR 80 (CATHETERS) IMPLANT
CLIP TI MEDIUM 24 (CLIP) ×2 IMPLANT
CLIP TI WIDE RED SMALL 24 (CLIP) ×2 IMPLANT
CNTNR URN SCR LID CUP LEK RST (MISCELLANEOUS) IMPLANT
CONT SPEC 4OZ STRL OR WHT (MISCELLANEOUS) ×2
COVER PROBE W GEL 5X96 (DRAPES) IMPLANT
CUFF TOURN SGL QUICK 24 (TOURNIQUET CUFF)
CUFF TOURN SGL QUICK 34 (TOURNIQUET CUFF)
CUFF TOURN SGL QUICK 42 (TOURNIQUET CUFF) IMPLANT
CUFF TRNQT CYL 24X4X16.5-23 (TOURNIQUET CUFF) IMPLANT
CUFF TRNQT CYL 34X4.125X (TOURNIQUET CUFF) IMPLANT
DERMABOND ADVANCED .7 DNX12 (GAUZE/BANDAGES/DRESSINGS) ×2 IMPLANT
DRAIN CHANNEL 15F RND FF W/TCR (WOUND CARE) IMPLANT
DRAPE HALF SHEET 40X57 (DRAPES) IMPLANT
DRAPE X-RAY CASS 24X20 (DRAPES) IMPLANT
DRESSING PEEL AND PLC PRVNA 13 (GAUZE/BANDAGES/DRESSINGS) IMPLANT
DRSG PEEL AND PLACE PREVENA 13 (GAUZE/BANDAGES/DRESSINGS) ×2
ELECT REM PT RETURN 9FT ADLT (ELECTROSURGICAL) ×2
ELECTRODE REM PT RTRN 9FT ADLT (ELECTROSURGICAL) ×2 IMPLANT
EVACUATOR SILICONE 100CC (DRAIN) IMPLANT
GLOVE BIO SURGEON STRL SZ7.5 (GLOVE) ×2 IMPLANT
GLOVE BIOGEL PI IND STRL 8 (GLOVE) ×2 IMPLANT
GLOVE SURG POLY ORTHO LF SZ7.5 (GLOVE) IMPLANT
GLOVE SURG UNDER LTX SZ8 (GLOVE) ×2 IMPLANT
GOWN STRL REUS W/ TWL LRG LVL3 (GOWN DISPOSABLE) ×6 IMPLANT
GOWN STRL REUS W/TWL LRG LVL3 (GOWN DISPOSABLE) ×6
HEMOSTAT HEMOBLAST BELLOWS (HEMOSTASIS) IMPLANT
KIT BASIN OR (CUSTOM PROCEDURE TRAY) ×2 IMPLANT
KIT DRSG PREVENA PLUS 7DAY 125 (MISCELLANEOUS) IMPLANT
KIT TURNOVER KIT B (KITS) ×2 IMPLANT
MARKER GRAFT CORONARY BYPASS (MISCELLANEOUS) IMPLANT
MARKER SKIN DUAL TIP RULER LAB (MISCELLANEOUS) IMPLANT
NS IRRIG 1000ML POUR BTL (IV SOLUTION) ×4 IMPLANT
PACK PERIPHERAL VASCULAR (CUSTOM PROCEDURE TRAY) ×2 IMPLANT
PAD ARMBOARD 7.5X6 YLW CONV (MISCELLANEOUS) ×4 IMPLANT
SET COLLECT BLD 21X3/4 12 (NEEDLE) IMPLANT
SET WALTER ACTIVATION W/DRAPE (SET/KITS/TRAYS/PACK) IMPLANT
SPONGE SURGIFOAM ABS GEL 100 (HEMOSTASIS) IMPLANT
STOPCOCK 4 WAY LG BORE MALE ST (IV SETS) IMPLANT
SUT ETHILON 3 0 PS 1 (SUTURE) IMPLANT
SUT MNCRL AB 4-0 PS2 18 (SUTURE) ×4 IMPLANT
SUT PROLENE 5 0 C 1 24 (SUTURE) ×2 IMPLANT
SUT PROLENE 6 0 BV (SUTURE) ×2 IMPLANT
SUT SILK 2 0 SH (SUTURE) ×2 IMPLANT
SUT SILK 3 0 (SUTURE) ×2
SUT SILK 3-0 18XBRD TIE 12 (SUTURE) IMPLANT
SUT VIC AB 2-0 CTB1 (SUTURE) ×4 IMPLANT
SUT VIC AB 3-0 SH 27 (SUTURE) ×6
SUT VIC AB 3-0 SH 27X BRD (SUTURE) ×4 IMPLANT
SYR 30ML LL (SYRINGE) IMPLANT
SYR 3ML LL SCALE MARK (SYRINGE) IMPLANT
SYR TB 1ML LUER SLIP (SYRINGE) IMPLANT
TAPE UMBILICAL 1/8X30 (MISCELLANEOUS) IMPLANT
TOWEL GREEN STERILE (TOWEL DISPOSABLE) ×2 IMPLANT
TRAY FOLEY MTR SLVR 16FR STAT (SET/KITS/TRAYS/PACK) ×2 IMPLANT
TUBING EXTENTION W/L.L. (IV SETS) IMPLANT
UNDERPAD 30X36 HEAVY ABSORB (UNDERPADS AND DIAPERS) ×2 IMPLANT
WATER STERILE IRR 1000ML POUR (IV SOLUTION) ×2 IMPLANT

## 2022-09-08 NOTE — Progress Notes (Addendum)
ANTICOAGULATION CONSULT NOTE - Initial Consult  Pharmacy Consult for Heparin Indication:  Femoral artery occlusion  Allergies  Allergen Reactions   Iohexol Itching    Does well with pre-medication    Ivp Dye [Iodinated Contrast Media] Itching    Does well with pre-medication   Morphine Other (See Comments)    Delerium   Otezla [Apremilast] Diarrhea and Nausea And Vomiting   Plaquenil [Hydroxychloroquine]     Patient Measurements: Height: 5' 10.98" (180.3 cm) Weight: 109.4 kg (241 lb 2.9 oz) IBW/kg (Calculated) : 75.26 Heparin Dosing Weight: 99.9kg  Vital Signs: Temp: 97.9 F (36.6 C) (06/23 1621) Temp Source: Oral (06/23 1621) BP: 104/67 (06/23 1621) Pulse Rate: 100 (06/23 1540)  Labs: Recent Labs    09/07/22 1612  HGB 16.1  HCT 48.5  PLT 165  CREATININE 1.59*     Estimated Creatinine Clearance: 55.9 mL/min (A) (by C-G formula based on SCr of 1.59 mg/dL (H)).   Medical History: Past Medical History:  Diagnosis Date   Anxiety    Arthritis    Cancer (HCC) 2008   Right lung   GERD (gastroesophageal reflux disease)    Hyperlipidemia    Renal disorder     Medications:  Medications Prior to Admission  Medication Sig Dispense Refill Last Dose   aspirin EC 81 MG tablet Take 81 mg by mouth every evening. Swallow whole.   09/05/2022 at pm   chlorpheniramine (CHLOR-TRIMETON) 4 MG tablet Take 4 mg by mouth 2 (two) times daily as needed for allergies.   Past Week   clopidogrel (PLAVIX) 75 MG tablet Take 75 mg by mouth every evening.   09/05/2022 at pm   docusate sodium (COLACE) 100 MG capsule Take 100 mg by mouth at bedtime.   09/06/2022   famotidine (PEPCID AC) 10 MG tablet Take 10 mg by mouth daily as needed for heartburn or indigestion.   09/07/2022   HYDROcodone-acetaminophen (NORCO) 10-325 MG tablet Take 1 tablet by mouth 3 (three) times daily as needed for moderate pain or severe pain.   09/07/2022   LORazepam (ATIVAN) 1 MG tablet Take 1 mg by mouth daily as  needed for anxiety.   Past Week   nicotine polacrilex (COMMIT) 4 MG lozenge Take 4 mg by mouth daily as needed for smoking cessation.   09/07/2022   traMADol (ULTRAM) 50 MG tablet Take 50 mg by mouth every 6 (six) hours as needed for moderate pain.   Past Week   triamcinolone ointment (KENALOG) 0.5 % Apply 1 Application topically daily as needed (for itching in ears).   Past Week   pantoprazole (PROTONIX) 40 MG tablet Take 1 tablet (40 mg total) by mouth daily. (Patient not taking: Reported on 09/08/2022) 30 tablet 0 Not Taking   simethicone (MYLICON) 80 MG chewable tablet Chew 80 mg by mouth every 6 (six) hours as needed for flatulence. (Patient not taking: Reported on 09/08/2022)   Not Taking    Scheduled:   aspirin EC  81 mg Oral QPM   clopidogrel  75 mg Oral QPM   diphenhydrAMINE  25 mg Oral Q6H   [START ON 09/09/2022] docusate sodium  100 mg Oral Daily   fentaNYL       ipratropium-albuterol       oxyCODONE       pantoprazole  40 mg Oral Daily   rosuvastatin  10 mg Oral QHS   Infusions:   sodium chloride     acetaminophen     ceFAZolin  ceFAZolin (ANCEF) IV 2 g (09/08/22 1258)   dextrose 5 % and 0.9 % NaCl 75 mL/hr at 09/08/22 1248   magnesium sulfate bolus IVPB     PRN: sodium chloride, acetaminophen, acetaminophen **OR** acetaminophen, bisacodyl, ceFAZolin, famotidine, fentaNYL, guaiFENesin-dextromethorphan, hydrALAZINE, HYDROmorphone (DILAUDID) injection, ipratropium-albuterol, labetalol, LORazepam, magnesium sulfate bolus IVPB, metoprolol tartrate, nicotine polacrilex, ondansetron, oxyCODONE, oxyCODONE, phenol, polyethylene glycol, potassium chloride, simethicone  Assessment: 67 yom with a history of AAA status post stenting . Patient is presenting with left leg pain. Heparin per pharmacy consult placed for  Femoral artery occlusion . S/p L femoral thrombectomy 6/23. Pharmacy consulted to resume IV heparin 6/23 PM without a bolus.  Hgb 16.1; plt 165  Goal of Therapy:   Heparin level 0.3-0.7 units/ml Monitor platelets by anticoagulation protocol: Yes   Plan:  Resume heparin infusion at 1700 units/hr at 5pm with no bolus Check anti-Xa level in 6 hours and daily while on heparin Continue to monitor H&H and platelets  Rexford Maus, PharmD, BCPS 09/08/2022 4:33 PM

## 2022-09-08 NOTE — ED Provider Notes (Incomplete)
  Physical Exam  BP (!) 138/91   Pulse (!) 107   Temp 98.4 F (36.9 C) (Oral)   Resp (!) 21   Ht 5\' 11"  (1.803 m)   Wt 113.4 kg   SpO2 93%   BMI 34.87 kg/m   Physical Exam  Procedures  Procedures  ED Course / MDM   Clinical Course as of 09/08/22 0002  Sat Sep 07, 2022  2110 I received a call from radiology that the patient does have an occlusion of the left femoral artery with collateral in the foot.  Vascular will be consulted. [VK]  2113 I spoke with Dr. Edilia Bo of vascular who recommended heparin drip and ED to ED transfer to [facility] for evaluation. [VK]  2119 Patient accepted by Dr. Suezanne Jacquet for ED to ED transfer at [facility]. [VK]  2331 Care of the patient assumed from preceding ED provider Dr. Theresia Lo at [facility] emergency department.  Patient transferred to our facility for in person vascular consult with concern for left femoral artery occlusion, started on heparin outside facility.  Difficult to find, but dopplerable pulse in the left DP per chart review.  Called to the bedside at time of patient's arrival to the ED due to difficulty dopplerable pulse in the left foot. Around the bedside foot is pink with some duskiness of the toes, however patient reports that salt color has significantly improved since initiation of heparin several hours ago.  No pain in the leg or change in sensation from his baseline from the last few days.  While I was attempting to Doppler pulse in the foot Dr. Edilia Bo, vascular surgeon presented to the bedside. [RS]    Clinical Course User Index [RS] Shyenne Maggard, Eugene Gavia, PA-C [VK] Rexford Maus, DO   Medical Decision Making Amount and/or Complexity of Data Reviewed Labs: ordered. Radiology: ordered.  Risk Prescription drug management. Decision regarding hospitalization.   ***

## 2022-09-08 NOTE — ED Notes (Signed)
ED TO INPATIENT HANDOFF REPORT  ED Nurse Name and Phone #: Donia Pounds [phone]  S Name/Age/Gender Troy Warner 67 y.o. male Room/Bed: 018C/018C  Code Status   Code Status: Full Code  Home/SNF/Other Home Patient oriented to: self, place, time, and situation Is this baseline? Yes   Triage Complete: Triage complete  Chief Complaint Ischemic leg [I99.8]  Triage Note Pt via POV c/o left lower leg pain and numbness since last Thursday. He was doing yard work and felt a pull and a pop. Went to [facility]; workup showed no DVT and no fracture. Pt was discharged with rx for muscle relaxants and stretches. Pt has run out of muscle relaxer and says symptoms have not resolved. Called PCP and was advised to come for CT scan. Ambulatory to triage.    Allergies Allergies  Allergen Reactions   Iohexol Itching    Does well with pre-medication    Ivp Dye [Iodinated Contrast Media] Itching    Does well with pre-medication   Morphine Other (See Comments)    Delerium   Otezla [Apremilast] Diarrhea and Nausea And Vomiting   Plaquenil [Hydroxychloroquine]     Level of Care/Admitting Diagnosis ED Disposition     ED Disposition  Admit   Condition  --   Comment  Hospital Area: [facility] [[id number]]  Level of Care: Progressive [102]  Admit to Progressive based on following criteria: CARDIOVASCULAR & THORACIC of moderate stability with acute coronary syndrome symptoms/low risk myocardial infarction/hypertensive urgency/arrhythmias/heart failure potentially compromising stability and stable post cardiovascular intervention patients.  May admit patient to [facility] or [facility] if equivalent level of care is available:: No  Covid Evaluation: Asymptomatic - no recent exposure (last 10 days) testing not required  Diagnosis: Ischemic leg [454098]  Admitting Physician: Ellwood Dense  Attending Physician: Ellwood Dense   Certification:: I certify this patient will need inpatient services for at least 2 midnights  Estimated Length of Stay: 2          B Medical/Surgery History Past Medical History:  Diagnosis Date   Cancer (HCC) 2008   Right lung   Hyperlipidemia    Renal disorder    Past Surgical History:  Procedure Laterality Date   ABDOMINAL AORTIC ANEURYSM REPAIR  2022   LUNG REMOVAL, PARTIAL Right 2008     A IV Location/Drains/Wounds Patient Lines/Drains/Airways Status     Active Line/Drains/Airways     Name Placement date Placement time Site Days   Peripheral IV 09/07/22 18 G 1.16" Left Antecubital 09/07/22  1610  Antecubital  1   Peripheral IV 09/07/22 20 G Left;Posterior Hand 09/07/22  2140  Hand  1            Intake/Output Last 24 hours  Intake/Output Summary (Last 24 hours) at 09/08/2022 0001 Last data filed at 09/07/2022 2319 Gross per 24 hour  Intake 1000 ml  Output --  Net 1000 ml    Labs/Imaging Results for orders placed or performed during the hospital encounter of 09/07/22 (from the past 48 hour(s))  Basic metabolic panel     Status: Abnormal   Collection Time: 09/07/22  4:12 PM  Result Value Ref Range   Sodium 138 135 - 145 mmol/L   Potassium 3.9 3.5 - 5.1 mmol/L   Chloride 103 98 - 111 mmol/L   CO2 25 22 - 32 mmol/L   Glucose, Bld 94 70 - 99 mg/dL    Comment: Glucose reference range applies  only to samples taken after fasting for at least 8 hours.   BUN 16 8 - 23 mg/dL   Creatinine, Ser 8.11 (H) 0.61 - 1.24 mg/dL   Calcium 9.2 8.9 - 91.4 mg/dL   GFR, Estimated 47 (L) >60 mL/min    Comment: (NOTE) Calculated using the CKD-EPI Creatinine Equation (2021)    Anion gap 10 5 - 15    Comment: Performed at [facility], [address] [place], [place] [address]  CBC with Differential     Status: None   Collection Time: 09/07/22  4:12 PM  Result Value Ref Range   WBC 5.4 4.0 - 10.5 K/uL   RBC 5.59 4.22 - 5.81 MIL/uL   Hemoglobin 16.1 13.0 -  17.0 g/dL   HCT 62.1 30.8 - 65.7 %   MCV 86.8 80.0 - 100.0 fL   MCH 28.8 26.0 - 34.0 pg   MCHC 33.2 30.0 - 36.0 g/dL   RDW 84.6 96.2 - 95.2 %   Platelets 165 150 - 400 K/uL   nRBC 0.0 0.0 - 0.2 %   Neutrophils Relative % 48 %   Neutro Abs 2.6 1.7 - 7.7 K/uL   Lymphocytes Relative 37 %   Lymphs Abs 2.0 0.7 - 4.0 K/uL   Monocytes Relative 13 %   Monocytes Absolute 0.7 0.1 - 1.0 K/uL   Eosinophils Relative 2 %   Eosinophils Absolute 0.1 0.0 - 0.5 K/uL   Basophils Relative 0 %   Basophils Absolute 0.0 0.0 - 0.1 K/uL   Immature Granulocytes 0 %   Abs Immature Granulocytes 0.01 0.00 - 0.07 K/uL    Comment: Performed at [facility], [address] [place], [place] [address]   CT ANGIO LOWER EXT BILAT W &/OR WO CONTRAST  Result Date: 09/07/2022 CLINICAL DATA:  Left lower leg pain and numbness since Thursday EXAM: CT ANGIOGRAPHY OF ABDOMINAL AORTA WITH ILIOFEMORAL RUNOFF TECHNIQUE: Multidetector CT imaging of the abdomen, pelvis and lower extremities was performed using the standard protocol during bolus administration of intravenous contrast. Multiplanar CT image reconstructions and MIPs were obtained to evaluate the vascular anatomy. RADIATION DOSE REDUCTION: This exam was performed according to the departmental dose-optimization program which includes automated exposure control, adjustment of the mA and/or kV according to patient size and/or use of iterative reconstruction technique. CONTRAST:  OMNIPAQUE IOHEXOL 350 MG/ML SOLN COMPARISON:  08/30/2022, 09/07/2021 FINDINGS: VASCULAR Aorta: Distal margin of endoluminal stent graft is seen within a chronic abdominal aortic aneurysm. The excluded visualized portion of the aortic aneurysm is thrombosed. The iliac limbs of the endoluminal stent graft are widely patent. RIGHT Lower Extremity Inflow: Patent endoluminal stent graft through the right common iliac artery. The right external and internal iliac arteries are widely patent.  Outflow: Common, superficial and profunda femoral arteries and the popliteal artery are patent without evidence of aneurysm, dissection, vasculitis or significant stenosis. Runoff: There is irregular plaque or thrombus within the distal popliteal artery just proximal to the origin of the anterior tibial artery, with estimated 50-70% stenosis. Anterior tibial artery is widely patent throughout its course. There is plaque or thrombus at the level the bifurcation of the tibioperoneal trunk, with estimated 50% stenosis. The peroneal and posterior tibial arteries are widely patent through the level of the ankle. LEFT Lower Extremity Inflow: Patent limb of the endoluminal stent graft within the common iliac artery. The external and internal iliac arteries are widely patent at their origins. Outflow: There is complete  occlusion of the left common femoral artery from the level of the inguinal ligament through the bifurcation. Proximal aspects of the left profundus femoral and superficial femoral arteries are occluded, with immediate reconstitution via collateral flow. Remaining portions of the left SFA and profundus femoral artery are widely patent. Runoff: Filling defect is seen within the distal left popliteal artery, extending into the tibioperoneal trunk and origin of the left anterior tibial artery. This may reflect a distal embolus, with greater than 50% stenosis. The trifurcation vessels are opacified through the level of the ankle. Veins: No obvious venous abnormality within the limitations of this arterial phase study. Review of the MIP images confirms the above findings. NON-VASCULAR Adrenals/Urinary Tract: Visualized portion of the left kidney is unremarkable. Normal excretion of contrast in the left ureter. Bladder is unremarkable. Stomach/Bowel: No bowel obstruction or ileus. Scattered colonic diverticulosis without diverticulitis. Lymphatic: No pathologic adenopathy. Reproductive: Stable mild enlargement of  the prostate. Other: No free fluid or free intraperitoneal gas. Fat containing umbilical and right inguinal hernias are again noted. No bowel herniation. Musculoskeletal: No acute or destructive bony abnormalities. Reconstructed images demonstrate no additional findings. IMPRESSION: VASCULAR 1. 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 There is immediate reconstitution of the left SFA and profundus femoral arteries via collateral flow. It is unclear whether this occlusion is as result of insitu thrombus or thrombosed dissection. There is no significant atheromatous plaque on prior study 09/07/2021, but note is made that this is the location of prior cutdown for catheterization for endoluminal stent graft placement. 2. Distal emboli within the left popliteal artery and tibioperoneal trunk, with the proximally 50% stenosis. Trifurcation vessels are opacified through the level of the ankle however. 3. Irregular filling defects within the distal right popliteal artery and tibioperoneal trunk, favor atheromatous plaque over distal emboli. 4. Prior endoluminal stent graft repair of an abdominal aortic aneurysm, incompletely visualized on this study. NON-VASCULAR 1. Distal colonic diverticulosis without diverticulitis. 2. Fat containing umbilical and right inguinal hernias. Critical Value/emergent results were called by telephone at the time of interpretation on 09/07/2022 at 9:02 pm to provider [facility] , who verbally acknowledged these results. Electronically Signed   By: Sharlet Salina M.D.   On: 09/07/2022 21:09    Pending Labs Unresulted Labs (From admission, onward)     Start     Ordered   09/10/22 0500  Heparin level (unfractionated)  Daily,   R     See Hyperspace for full Linked Orders Report.   09/07/22 2315   09/08/22 0600  Heparin level (unfractionated)  Once-Timed,   URGENT        09/07/22 2315   09/08/22 0600  CBC  Daily,   R     See  Hyperspace for full Linked Orders Report.   09/07/22 2315   09/07/22 2355  Type and screen [facility]  Once,   R       Comments: [facility]    09/07/22 2357   09/07/22 2352  HIV Antibody (routine testing w rflx)  (HIV Antibody (Routine testing w reflex) panel)  Once,   R        09/07/22 2357            Vitals/Pain Today's Vitals   09/07/22 2000 09/07/22 2200 09/07/22 2345 09/07/22 2349  BP: (!) 137/90 129/81 (!) 138/91   Pulse: (!) 104 (!) 115 (!) 107   Resp: 19 20 (!) 21  Temp:    98.4 F (36.9 C)  TempSrc:    Oral  SpO2: 93% 93% 93%   Weight:      Height:      PainSc:        Isolation Precautions No active isolations  Medications Medications  heparin ADULT infusion 100 units/mL (25000 units/261mL) (1,700 Units/hr Intravenous New Bag/Given 09/07/22 2135)  potassium chloride SA (KLOR-CON M) CR tablet 20-40 mEq (has no administration in time range)  ondansetron (ZOFRAN) injection 4 mg (has no administration in time range)  alum & mag hydroxide-simeth (MAALOX/MYLANTA) 200-200-20 MG/5ML suspension 15-30 mL (has no administration in time range)  pantoprazole (PROTONIX) EC tablet 40 mg (has no administration in time range)  labetalol (NORMODYNE) injection 10 mg (has no administration in time range)  hydrALAZINE (APRESOLINE) injection 5 mg (has no administration in time range)  metoprolol tartrate (LOPRESSOR) injection 2-5 mg (has no administration in time range)  guaiFENesin-dextromethorphan (ROBITUSSIN DM) 100-10 MG/5ML syrup 15 mL (has no administration in time range)  phenol (CHLORASEPTIC) mouth spray 1 spray (has no administration in time range)  dextrose 5 % and 0.45 % NaCl with KCl 20 mEq/L infusion (has no administration in time range)  HYDROmorphone (DILAUDID) injection 0.5-1 mg (has no administration in time range)  ceFAZolin (ANCEF) IVPB 2g/100 mL premix (has no administration in time range)  methylPREDNISolone sodium succinate  (SOLU-MEDROL) 40 mg/mL injection 40 mg (40 mg Intravenous Given 09/07/22 1610)  diphenhydrAMINE (BENADRYL) capsule 50 mg ( Oral See Alternative 09/07/22 1904)    Or  diphenhydrAMINE (BENADRYL) injection 50 mg (50 mg Intravenous Given 09/07/22 1904)  iohexol (OMNIPAQUE) 350 MG/ML injection 100 mL (100 mLs Intravenous Contrast Given 09/07/22 2048)  sodium chloride 0.9 % bolus 1,000 mL (0 mLs Intravenous Stopped 09/07/22 2319)  heparin bolus via infusion 5,500 Units (5,500 Units Intravenous Bolus from Bag 09/07/22 2135)    Mobility walks     Focused Assessments     R Recommendations: See Admitting Provider Note  Report given to:   Additional Notes: No doppler pulse in left foot. Verified by 2 physicians. Surgery in AM.

## 2022-09-08 NOTE — Anesthesia Preprocedure Evaluation (Signed)
Anesthesia Evaluation  Patient identified by MRN, date of birth, ID band Patient awake    Reviewed: Allergy & Precautions, NPO status , Patient's Chart, lab work & pertinent test results  History of Anesthesia Complications Negative for: history of anesthetic complications  Airway Mallampati: II  TM Distance: >3 FB Neck ROM: Full    Dental  (+) Edentulous Upper, Edentulous Lower, Dental Advisory Given   Pulmonary neg shortness of breath, neg sleep apnea, neg COPD, neg recent URI, former smoker   breath sounds clear to auscultation       Cardiovascular (-) hypertension(-) angina + Peripheral Vascular Disease  (-) Past MI and (-) CHF (-) dysrhythmias  Rhythm:Regular     Neuro/Psych  PSYCHIATRIC DISORDERS Anxiety     negative neurological ROS     GI/Hepatic Neg liver ROS,GERD  Medicated and Controlled,,  Endo/Other  negative endocrine ROS    Renal/GU Renal InsufficiencyRenal diseaseLab Results      Component                Value               Date                      NA                       138                 09/07/2022                K                        3.9                 09/07/2022                CO2                      25                  09/07/2022                GLUCOSE                  94                  09/07/2022                BUN                      16                  09/07/2022                CREATININE               1.59 (H)            09/07/2022                CALCIUM                  9.2                 09/07/2022                GFRNONAA  47 (L)              09/07/2022                Musculoskeletal  (+) Arthritis ,    Abdominal   Peds  Hematology Lab Results      Component                Value               Date                      WBC                      5.4                 09/07/2022                HGB                      16.1                09/07/2022                 HCT                      48.5                09/07/2022                MCV                      86.8                09/07/2022                PLT                      165                 09/07/2022             plavix   Anesthesia Other Findings   Reproductive/Obstetrics                              Anesthesia Physical Anesthesia Plan  ASA: 2  Anesthesia Plan: General   Post-op Pain Management: Ofirmev IV (intra-op)*   Induction: Intravenous  PONV Risk Score and Plan: 2 and Ondansetron and Dexamethasone  Airway Management Planned: Oral ETT  Additional Equipment: None  Intra-op Plan:   Post-operative Plan: Extubation in OR  Informed Consent: I have reviewed the patients History and Physical, chart, labs and discussed the procedure including the risks, benefits and alternatives for the proposed anesthesia with the patient or authorized representative who has indicated his/her understanding and acceptance.     Dental advisory given  Plan Discussed with: CRNA  Anesthesia Plan Comments:          Anesthesia Quick Evaluation

## 2022-09-08 NOTE — Transfer of Care (Signed)
Immediate Anesthesia Transfer of Care Note  Patient: Troy Warner  Procedure(s) Performed: LEFT FEMORAL ARTERY THROMBECTOMY (Left: Leg Upper) LEFT GREATER SAPHENOUS VEIN HARVEST (Left: Leg Upper) LEFT FEMORAL ARTERTY PATCH ANGIOPLASTY USING SAPHENOUS VEIN (Left: Groin) APPLICATION OF LEFT GROIN PREVENA WOUND VAC (Left: Groin)  Patient Location: PACU  Anesthesia Type:General  Level of Consciousness: awake, alert , and oriented  Airway & Oxygen Therapy: Patient Spontanous Breathing  Post-op Assessment: Report given to RN and Post -op Vital signs reviewed and stable  Post vital signs: Reviewed and stable  Last Vitals:  Vitals Value Taken Time  BP 127/76 09/08/22 1101  Temp    Pulse 100 09/08/22 1109  Resp 8 09/08/22 1109  SpO2 90 % 09/08/22 1109  Vitals shown include unvalidated device data.  Last Pain:  Vitals:   09/08/22 0641  TempSrc: Oral  PainSc:          Complications: No notable events documented.

## 2022-09-08 NOTE — Anesthesia Postprocedure Evaluation (Signed)
Anesthesia Post Note  Patient: Troy Warner  Procedure(s) Performed: LEFT FEMORAL ARTERY THROMBECTOMY (Left: Leg Upper) LEFT GREATER SAPHENOUS VEIN HARVEST (Left: Leg Upper) LEFT FEMORAL ARTERTY PATCH ANGIOPLASTY USING SAPHENOUS VEIN (Left: Groin) APPLICATION OF LEFT GROIN PREVENA WOUND VAC (Left: Groin)     Patient location during evaluation: PACU Anesthesia Type: General Level of consciousness: awake and alert Pain management: pain level controlled Vital Signs Assessment: post-procedure vital signs reviewed and stable Respiratory status: spontaneous breathing, nonlabored ventilation, respiratory function stable and patient connected to nasal cannula oxygen Cardiovascular status: blood pressure returned to baseline and stable Postop Assessment: no apparent nausea or vomiting Anesthetic complications: no   No notable events documented.  Last Vitals:  Vitals:   09/08/22 1621 09/08/22 1820  BP: 104/67 126/73  Pulse: 100 99  Resp: 15 15  Temp: 36.6 C   SpO2: 97% 94%    Last Pain:  Vitals:   09/08/22 1629  TempSrc:   PainSc: 5                  Fenris Cauble

## 2022-09-08 NOTE — Anesthesia Procedure Notes (Signed)
Procedure Name: Intubation Date/Time: 09/08/2022 7:46 AM  Performed by: Gwenyth Allegra, CRNAPre-anesthesia Checklist: Patient identified, Emergency Drugs available, Suction available, Patient being monitored and Timeout performed Patient Re-evaluated:Patient Re-evaluated prior to induction Oxygen Delivery Method: Circle system utilized Preoxygenation: Pre-oxygenation with 100% oxygen Induction Type: IV induction Ventilation: Mask ventilation without difficulty and Oral airway inserted - appropriate to patient size Laryngoscope Size: Mac and 4 Grade View: Grade II Tube type: Oral Tube size: 8.0 mm Number of attempts: 1 Placement Confirmation: ETT inserted through vocal cords under direct vision, positive ETCO2 and breath sounds checked- equal and bilateral Secured at: 23 cm Dental Injury: Teeth and Oropharynx as per pre-operative assessment

## 2022-09-08 NOTE — ED Provider Notes (Signed)
Physical Exam  BP (!) 138/91   Pulse (!) 107   Temp 98.4 F (36.9 C) (Oral)   Resp (!) 21   Ht 5\' 11"  (1.803 m)   Wt 113.4 kg   SpO2 93%   BMI 34.87 kg/m   Physical Exam Vitals and nursing note reviewed.  HENT:     Head: Normocephalic and atraumatic.  Eyes:     General: No scleral icterus.       Right eye: No discharge.        Left eye: No discharge.     Conjunctiva/sclera: Conjunctivae normal.  Pulmonary:     Effort: Pulmonary effort is normal.  Skin:    General: Skin is warm and dry.  Neurological:     General: No focal deficit present.     Mental Status: He is alert.  Psychiatric:        Mood and Affect: Mood normal.     Procedures  Procedures  ED Course / MDM   Clinical Course as of 09/08/22 0002  Sat Sep 07, 2022  2110 I received a call from radiology that the patient does have an occlusion of the left femoral artery with collateral in the foot.  Vascular will be consulted. [VK]  2113 I spoke with Dr. Edilia Bo of vascular who recommended heparin drip and ED to ED transfer to [facility] for evaluation. [VK]  2119 Patient accepted by Dr. Suezanne Jacquet for ED to ED transfer at [facility]. [VK]  2331 Care of the patient assumed from preceding ED provider Dr. Theresia Lo at [facility] emergency department.  Patient transferred to our facility for in person vascular consult with concern for left femoral artery occlusion, started on heparin outside facility.  Difficult to find, but dopplerable pulse in the left DP per chart review.  Called to the bedside at time of patient's arrival to the ED due to difficulty dopplerable pulse in the left foot. Around the bedside foot is pink with some duskiness of the toes, however patient reports that salt color has significantly improved since initiation of heparin several hours ago.  No pain in the leg or change in sensation from his baseline from the last few days.  While I was attempting to Doppler pulse in the foot Dr. Edilia Bo, vascular surgeon  presented to the bedside. [RS]    Clinical Course User Index [RS] Nathifa Ritthaler, Eugene Gavia, PA-C [VK] Rexford Maus, DO   Medical Decision Making Amount and/or Complexity of Data Reviewed Labs: ordered. Radiology: ordered.  Risk Prescription drug management. Decision regarding hospitalization.    67 year old male who presents from [facility] for vascular surgery consultation.  Patient with history of AAA with stenting, who presented to [facility] today for severe left leg pain was found to haveComplete occlusion of the left common femoral artery at the level of the bifurcation as well as distal emboli in the left popliteal and tibioperoneal arteries.  Patient initiated on heparin outside facility, presents during transfer.  Dr. Edilia Bo vascular surgeon presented to the bedside while I was meeting the patient.  He has been admitted to vascular surgery service.  Hemodynamically stable at this time, continues on heparin drip.  Othal  voiced understanding of his medical evaluation and treatment plan. Each of their questions answered to their expressed satisfaction.  He is amenable to plan for admission at this time.   This chart was dictated using voice recognition software, Dragon. Despite the best efforts of this provider to proofread and correct errors, errors may  still occur which can change documentation meaning.          Paris Lore, PA-C 09/08/22 0015    Melene Plan, DO 09/08/22 0016

## 2022-09-08 NOTE — Op Note (Signed)
NAME: Troy Warner    MRN: [id number] DOB: [birth year]    DATE OF OPERATION: 09/08/2022  PREOP DIAGNOSIS:    Ischemic left lower extremity  POSTOP DIAGNOSIS:    Same  PROCEDURE:    Left femoral thrombectomy Harvesting of left great saphenous vein Vein patch angioplasty left common femoral artery Placement of Prevena dressing  SURGEON: Di Kindle. Edilia Bo, MD  ASSIST: Darlin Coco, PA  ANESTHESIA: General  EBL: 100 cc  INDICATIONS:    Troy Warner is a 67 y.o. male who had undergone an EVAR in 2008 and then subsequently underwent a FEVAR in [place] in 2022.  He developed a sudden onset of left leg pain and numbness on 08/29/2022.  He ultimately presented to the emergency department when his symptoms were not getting better and was transferred to [facility] for vascular evaluation.  He had a barely audible anterior tibial signal with a Doppler he is brought for femoral thrombectomy.  FINDINGS:   There was a well organized clot in the common femoral artery extending into the proximal superficial femoral artery and deep femoral artery.  This was removed en bloc.  Photograph is noted below.  The Fogarty catheter passed the entire length with no clot retrieved after multiple passes.  I suspect th subtle finding in the popliteal artery and tibial peroneal trunk is chronic.  The patient did have a history of some left calf claudication.  I did not shoot an intraoperative arteriogram as he has a dye allergy and also has chronic kidney disease.    TECHNIQUE:   The patient was taken to the operating room and received a general anesthetic.  The right groin left groin and entire left lower extremity were prepped and draped in usual sterile fashion.  A longitudinal incision was made in the left groin and the dissection carried down to the common femoral artery.  There was significant scar tissue where he had a previous Perclose for his endovascular  aneurysm repair.  Through dense scar tissue I dissected out the common femoral artery and this dissection continued up onto the external iliac artery well under the inguinal ligament.  In order to get adequate exposure we used a Engineer, technical sales.  I then dissected out the proximal superficial femoral artery and deep femoral arteries which were controlled.  The patient was heparinized.  The great saphenous vein was harvested from the saphenofemoral junction down to the proximal thigh using 1 distal additional incision.  Branches were divided tween clips and 3-0 silk ties.  The external iliac artery, superficial epigastric branch, deep femoral and superficial femoral arteries were controlled.  A longitudinal arteriotomy was made in the common femoral artery.  There was organized clot here which had been present for 10 days based on his history.  I used an endarterectomy spatula to carefully remove the clot en bloc.  I released the proximal clamp to flush it out proximally and the clot came out with a nice endpoint.  I did pass a Fogarty catheter and there was no further clot retrieved.  I then was able to get the clot from the proximal superficial femoral and deep femoral arteries and 1 piece.  The Fogarty catheter was passed down the deep femoral artery and no further clot was retrieved.  I passed a long 3 Fogarty catheter and a long 4 Fogarty catheter the entire length and received no clot after multiple passes.  There was excellent backbleeding.  The saphenous vein was opened  longitudinally.  The vein patch was then sewn using continuous 5-0 Prolene suture.  Prior to completing patch closure the artery was backbled and flushed appropriately and the anastomosis completed.  At the completion there was an excellent posterior tibial and dorsalis pedis signal with Doppler.  Hemostasis was obtained of the wounds.  The calves were soft.  I did not think fasciotomy was necessary.  The groin incision was closed with 2  deep layers of 2-0 Vicryl, 2 subcutaneous layers of 3-0 Vicryl and the skin closed with 4-0 Monocryl.  A Prevena dressing was applied.  The vein harvest site was closed with a deep 3-0 Vicryl and the skin closed with 4-0 Monocryl.  Dermabond was applied.  Patient tolerated the procedure well was transferred to the recovery room in stable condition.  All needle and sponge counts were correct.  Given the complexity of the case a first assistant was necessary in order to expedient the procedure and safely perform the technical aspects of the operation.  Waverly Ferrari, MD, FACS [facility]  DATE OF DICTATION:   09/08/2022

## 2022-09-08 NOTE — Interval H&P Note (Signed)
History and Physical Interval Note:  09/08/2022 6:12 AM  [name]  has presented today for surgery, with the diagnosis of OCCLUDED LEFT CIA.  The various methods of treatment have been discussed with the patient and family. After consideration of risks, benefits and other options for treatment, the patient has consented to  Procedure(s): FEMORAL-POPLITEAL THROMBECTOMY POSSIBLE FEM-FEM BYPASS (Left) as a surgical intervention.  The patient's history has been reviewed, patient examined, no change in status, stable for surgery.  I have reviewed the patient's chart and labs.  Questions were answered to the patient's satisfaction.     Troy Warner

## 2022-09-09 ENCOUNTER — Other Ambulatory Visit (HOSPITAL_COMMUNITY): Payer: Self-pay

## 2022-09-09 ENCOUNTER — Encounter (HOSPITAL_COMMUNITY): Payer: Self-pay | Admitting: Vascular Surgery

## 2022-09-09 LAB — BASIC METABOLIC PANEL
Anion gap: 13 (ref 5–15)
BUN: 23 mg/dL (ref 8–23)
CO2: 23 mmol/L (ref 22–32)
Calcium: 8.7 mg/dL — ABNORMAL LOW (ref 8.9–10.3)
Chloride: 103 mmol/L (ref 98–111)
Creatinine, Ser: 1.46 mg/dL — ABNORMAL HIGH (ref 0.61–1.24)
GFR, Estimated: 52 mL/min — ABNORMAL LOW (ref >60)
Glucose, Bld: 97 mg/dL (ref 70–99)
Potassium: 4 mmol/L (ref 3.5–5.1)
Sodium: 139 mmol/L (ref 135–145)

## 2022-09-09 LAB — LIPID PANEL
Cholesterol: 129 mg/dL (ref 0–200)
HDL: 31 mg/dL — ABNORMAL LOW (ref >40)
LDL Cholesterol: 76 mg/dL (ref 0–99)
Total CHOL/HDL Ratio: 4.2 RATIO
Triglycerides: 112 mg/dL (ref <150)
VLDL: 22 mg/dL (ref 0–40)

## 2022-09-09 LAB — CBC
HCT: 41.6 % (ref 39.0–52.0)
Hemoglobin: 13.3 g/dL (ref 13.0–17.0)
MCH: 29 pg (ref 26.0–34.0)
MCHC: 32 g/dL (ref 30.0–36.0)
MCV: 90.6 fL (ref 80.0–100.0)
Platelets: 138 10*3/uL — ABNORMAL LOW (ref 150–400)
RBC: 4.59 MIL/uL (ref 4.22–5.81)
RDW: 14.3 % (ref 11.5–15.5)
WBC: 10.4 10*3/uL (ref 4.0–10.5)
nRBC: 0 % (ref 0.0–0.2)

## 2022-09-09 LAB — HEPARIN LEVEL (UNFRACTIONATED)
Heparin Unfractionated: 0.26 IU/mL — ABNORMAL LOW (ref 0.30–0.70)
Heparin Unfractionated: 0.45 IU/mL (ref 0.30–0.70)

## 2022-09-09 MED ORDER — RIVAROXABAN (XARELTO) VTE STARTER PACK (15 & 20 MG)
ORAL_TABLET | ORAL | 0 refills | Status: DC
  Filled 2022-09-09: qty 51, 28d supply, fill #0

## 2022-09-09 MED ORDER — HYDROCODONE-ACETAMINOPHEN 10-325 MG PO TABS
1.0000 | ORAL_TABLET | Freq: Three times a day (TID) | ORAL | Status: DC | PRN
  Administered 2022-09-09: 1 via ORAL
  Filled 2022-09-09: qty 1

## 2022-09-09 MED ORDER — TRAMADOL HCL 50 MG PO TABS
50.0000 mg | ORAL_TABLET | Freq: Four times a day (QID) | ORAL | Status: DC | PRN
  Administered 2022-09-09: 50 mg via ORAL
  Filled 2022-09-09: qty 1

## 2022-09-09 MED ORDER — RIVAROXABAN 15 MG PO TABS
15.0000 mg | ORAL_TABLET | ORAL | Status: AC
  Administered 2022-09-09: 15 mg via ORAL
  Filled 2022-09-09: qty 1

## 2022-09-09 MED ORDER — ROSUVASTATIN CALCIUM 10 MG PO TABS: 10.0000 mg | ORAL_TABLET | Freq: Every day | ORAL | 11 refills | Status: DC

## 2022-09-09 MED ORDER — RIVAROXABAN (XARELTO) VTE STARTER PACK (15 & 20 MG): ORAL_TABLET | ORAL | 0 refills | Status: DC

## 2022-09-09 MED ORDER — ROSUVASTATIN CALCIUM 20 MG PO TABS: 40.0000 mg | ORAL_TABLET | Freq: Every day | ORAL | Status: DC

## 2022-09-09 MED ORDER — ROSUVASTATIN CALCIUM 5 MG PO TABS: 10.0000 mg | ORAL_TABLET | Freq: Every day | ORAL | Status: DC

## 2022-09-09 NOTE — Progress Notes (Signed)
ANTICOAGULATION CONSULT NOTE - Follow Up Consult  Pharmacy Consult for heparin Indication:  femoral artery occlusion now s/p thrombectomy  Labs: Recent Labs    09/07/22 1612 09/08/22 2300  HGB 16.1  --   HCT 48.5  --   PLT 165  --   HEPARINUNFRC  --  0.26*  CREATININE 1.59*  --     Assessment: 67yo male subtherapeutic on heparin with initial dosing s/p thrombectomy; no infusion issues or signs of bleeding per RN.  Goal of Therapy:  Heparin level 0.3-0.7 units/ml   Plan:  Increase heparin infusion by 1 unit/kg/hr to 1800 units/hr. Check level in 6 hours.   Vernard Gambles, PharmD, BCPS 09/09/2022 12:37 AM

## 2022-09-09 NOTE — Progress Notes (Addendum)
Progress Note  VASCULAR SURGERY ASSESSMENT & PLAN:   POD 1 S/P LEFT FEMORAL THROMBECTOMY WITH VEIN PATCH ANGIOPLASTY: The patient has a palpable left dorsalis pedis pulse.  His calves are soft.  He has a Prevena on the left groin.  His echo was unremarkable.  The etiology of his femoral thrombosis is not clear.  He has undergone previous EVAR and previous FEVAR.  I expected to find an issue with the femoral artery however this was fairly clean.  He does have some dilatation of the proximal left common iliac artery below the stent but there was no thrombus seen on CT.  Given that the etiology of this is unclear I would recommend full dose Xarelto.  We can stop his aspirin and continue his Plavix.  He is very anxious to go home today so if arrangements can be made for him to get a Xarelto starter pack he could potentially go home today.  He is ambulating in the halls.  Cari Caraway, MD 11:20 AM   09/09/2022 5:50 AM 1 Day Post-Op  Subjective:  says he has pain but mostly in his back.  Says his leg feels much better.  Says he is on Norco regularly for back pain.  Wants this regimen in place.  Says he has walked in the hallways.  Afebrile HR 70's-100's NSR 100's-120's systolic 97% RA  Vitals:   09/09/22 0109 09/09/22 0416  BP: 102/68 109/73  Pulse: 83 77  Resp: 17 16  Temp: 98.2 F (36.8 C) 97.9 F (36.6 C)  SpO2: 96% 96%    Physical Exam: General:  no distress Cardiac:  regular Lungs:  non labored Incisions:  left groin with prevena with good seal Extremities:  easily palpable left DP/PT pulses; calf and anterior compartments are soft and non tender Abdomen:  soft  CBC    Component Value Date/Time   WBC 5.4 09/07/2022 1612   RBC 5.59 09/07/2022 1612   HGB 16.1 09/07/2022 1612   HCT 48.5 09/07/2022 1612   PLT 165 09/07/2022 1612   MCV 86.8 09/07/2022 1612   MCH 28.8 09/07/2022 1612   MCHC 33.2 09/07/2022 1612   RDW 13.7 09/07/2022 1612   LYMPHSABS 2.0 09/07/2022  1612   MONOABS 0.7 09/07/2022 1612   EOSABS 0.1 09/07/2022 1612   BASOSABS 0.0 09/07/2022 1612    BMET    Component Value Date/Time   NA 138 09/07/2022 1612   K 3.9 09/07/2022 1612   CL 103 09/07/2022 1612   CO2 25 09/07/2022 1612   GLUCOSE 94 09/07/2022 1612   BUN 16 09/07/2022 1612   CREATININE 1.59 (H) 09/07/2022 1612   CALCIUM 9.2 09/07/2022 1612   GFRNONAA 47 (L) 09/07/2022 1612    INR No results found for: "INR"   Intake/Output Summary (Last 24 hours) at 09/09/2022 0550 Last data filed at 09/09/2022 0300 Gross per 24 hour  Intake 1636.24 ml  Output 600 ml  Net 1036.24 ml   2D echo 09/08/2022: 1. Left ventricular ejection fraction, by estimation, is 65 to 70%. Left  ventricular ejection fraction by 2D MOD biplane is 67.5 %. The left  ventricle has normal function. The left ventricle has no regional wall  motion abnormalities. Left ventricular diastolic parameters are consistent with Grade I diastolic dysfunction (impaired relaxation).   2. Right ventricular systolic function is normal. The right ventricular  size is normal. Tricuspid regurgitation signal is inadequate for assessing PA pressure.   3. The mitral valve is abnormal. Trivial mitral valve  regurgitation.   4. The aortic valve is tricuspid. Aortic valve regurgitation is trivial.  Aortic valve sclerosis is present, with no evidence of aortic valve  stenosis.   5. Aortic dilatation noted. There is mild dilatation of the aortic root,  measuring 41 mm.   6. The inferior vena cava is normal in size with greater than 50%  respiratory variability, suggesting right atrial pressure of 3 mmHg    Assessment/Plan:  67 y.o. male is s/p:  Left femoral thrombectomy with vein patch angioplasty left CFA 1 Day Post-Op   -pt with palpable left DP/PT pulses.  His left calf and anterior compartments are soft and non tender.   -echo with normal EF.  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  will need cardiology to follow.   No cardiac thrombus noted.  -pt has ambulated.  -DVT prophylaxis:  heparin gtt -CBC/BMP pending -pt currently on asa/plavix/statin   Doreatha Massed, PA-C [facility] [phone] 09/09/2022 5:50 AM

## 2022-09-09 NOTE — Progress Notes (Deleted)
PHARMACIST LIPID MONITORING   Troy Warner is a 67 y.o. male admitted on 09/07/2022 with artery occlusion.  Pharmacy has been consulted to optimize lipid-lowering therapy with the indication of secondary prevention for clinical ASCVD.  Recent Labs:  Lipid Panel (last 6 months):   Lab Results  Component Value Date   CHOL 129 09/09/2022   TRIG 112 09/09/2022   HDL 31 (L) 09/09/2022   CHOLHDL 4.2 09/09/2022   VLDL 22 09/09/2022   LDLCALC 76 09/09/2022    Hepatic function panel (last 6 months):   No results found for: "AST", "ALT", "ALKPHOS", "BILITOT", "BILIDIR", "IBILI"  SCr (since admission):   Serum creatinine: 1.46 mg/dL (H) 78/29/56 2130 Estimated creatinine clearance: 60.9 mL/min (A)  Current therapy and lipid therapy tolerance Current lipid-lowering therapy: Crestor 10 mg po daily   Assessment:   Patient agrees with changes to lipid-lowering therapy LDL 76  Plan:    1.Statin intensity (high intensity recommended for all patients regardless of the LDL):  Add or increase statin to high intensity.  2.Add ezetimibe (if any one of the following):   Not indicated at this time.  3.Refer to lipid clinic:   No  4.Follow-up with:  Primary care provider  5.Follow-up labs after discharge:  Changes in lipid therapy were made. Check a lipid panel in 8-12 weeks then annually.       Elwin Sleight, PharmD 09/09/2022, 9:02 AM

## 2022-09-09 NOTE — Progress Notes (Signed)
Medication delivered to bedside.

## 2022-09-09 NOTE — Evaluation (Signed)
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 6/23 Lt femoral thrombectomy with angioplasty. PMhx:HLD, renal disorder, Rt lung CA  Clinical Impression  Pt pleasant with minor pain in left thigh. Pt ROM WFL able to extend knee, dorsiflex and perform hip flexion without issue.Pt walking and performing all mobility independently including stairs with assist of wife at home as needed. Pt at baseline functional status with education for walking, HEP and VAC without further needs at this time. Will sign off with pt aware and agreeable.        Recommendations for follow up therapy are one component of a multi-disciplinary discharge planning process, led by the attending physician.  Recommendations may be updated based on patient status, additional functional criteria and insurance authorization.  Follow Up Recommendations       Assistance Recommended at Discharge None  Patient can return home with the following       Equipment Recommendations None recommended by PT  Recommendations for Other Services       Functional Status Assessment Patient has not had a recent decline in their functional status     Precautions / Restrictions Precautions Precautions: Other (comment) Precaution Comments: VAc      Mobility  Bed Mobility Overal bed mobility: Modified Independent                  Transfers Overall transfer level: Independent                      Ambulation/Gait Ambulation/Gait assistance: Independent Gait Distance (Feet): 400 Feet Assistive device: None Gait Pattern/deviations: WFL(Within Functional Limits)   Gait velocity interpretation: >4.37 ft/sec, indicative of normal walking speed      Stairs Stairs: Yes Stairs assistance: Modified  independent (Device/Increase time) Stair Management: No rails Number of Stairs: 2    Wheelchair Mobility    Modified Rankin (Stroke Patients Only)       Balance Overall balance assessment: Independent                                           Pertinent Vitals/Pain Pain Assessment Pain Assessment: 0-10 Pain Score: 2  Pain Location: left thigh Pain Descriptors / Indicators: Aching Pain Intervention(s): Limited activity within patient's tolerance, Repositioned    Home Living Family/patient expects to be discharged to:: Private residence Living Arrangements: Spouse/significant other Available Help at Discharge: Family;Available 24 hours/day Type of Home: House Home Access: Stairs to enter   [employer] of Steps: 1   Home Layout: Multi-level;Able to live on main level with bedroom/bathroom Home Equipment: Gilmer Mor - single point      Prior Function Prior Level of Function : Independent/Modified Independent;Driving                     Hand Dominance        Extremity/Trunk Assessment   Upper Extremity Assessment Upper Extremity Assessment: Overall WFL for tasks assessed    Lower Extremity Assessment Lower Extremity Assessment: Overall WFL for tasks assessed    Cervical / Trunk Assessment Cervical / Trunk Assessment: Normal  Communication   Communication: No difficulties  Cognition Arousal/Alertness: Awake/alert Behavior During Therapy: WFL for tasks assessed/performed Overall Cognitive  Status: Within Functional Limits for tasks assessed                                          General Comments      Exercises     Assessment/Plan    PT Assessment Patient does not need any further PT services  PT Problem List         PT Treatment Interventions      PT Goals (Current goals can be found in the Care Plan section)  Acute Rehab PT Goals PT Goal Formulation: All assessment and education complete, DC  therapy    Frequency       Co-evaluation               AM-PAC PT "6 Clicks" Mobility  Outcome Measure Help needed turning from your back to your side while in a flat bed without using bedrails?: None Help needed moving from lying on your back to sitting on the side of a flat bed without using bedrails?: None Help needed moving to and from a bed to a chair (including a wheelchair)?: None Help needed standing up from a chair using your arms (e.g., wheelchair or bedside chair)?: None Help needed to walk in hospital room?: None Help needed climbing 3-5 steps with a railing? : None 6 Click Score: 24    End of Session   Activity Tolerance: Patient tolerated treatment well Patient left: in bed (pt denied up to chair so wife could rest in recliner)   PT Visit Diagnosis: Other abnormalities of gait and mobility (R26.89)    Time: 1020-1035 PT Time Calculation (min) (ACUTE ONLY): 15 min   Charges:   PT Evaluation $PT Eval Low Complexity: 1 Low          Damarious Holtsclaw P, PT Acute Rehabilitation Services Office: [phone]   Enedina Finner Betzalel Umbarger 09/09/2022, 10:45 AM

## 2022-09-09 NOTE — Progress Notes (Signed)
   09/09/22 1139  AVS Discharge Documentation  AVS Discharge Instructions Including Medications Provided to patient/caregiver  Name of Person Receiving AVS Discharge Instructions Including Medications Verlon Setting  Name of Clinician That Reviewed AVS Discharge Instructions Including Medications Silverio Decamp RN     AVS reviewed with patient and wife at bedside. All personal belongings were returned, all questions answered.

## 2022-09-09 NOTE — TOC Transition Note (Signed)
Transition of Care (TOC) - CM/SW Discharge Note Donn Pierini RN, BSN Transitions of Care Unit 4E- RN Case Manager See Treatment Team for direct phone #   Patient Details  Name: Jamair Cato MRN: [id number] Date of Birth: 05/10/1955  Transition of Care [facility]) CM/SW Contact:  Darrold Span, RN Phone Number: 09/09/2022, 1:51 PM   Clinical Narrative:    Pt stable for transition home today, no TOC needs noted, wife to transport home.  Will go home with Prevena VAC on Left groin. Vascular to follow up in office.    Final next level of care: Home/Self Care Barriers to Discharge: No Barriers Identified   Patient Goals and CMS Choice   Choice offered to / list presented to : NA  Discharge Placement                 Home        Discharge Plan and Services Additional resources added to the After Visit Summary for     Discharge Planning Services: NA Post Acute Care Choice: NA          DME Arranged: N/A DME Agency: NA       HH Arranged: NA HH Agency: NA        Social Determinants of Health (SDOH) Interventions SDOH Screenings   Tobacco Use: Medium Risk (09/09/2022)     Readmission Risk Interventions     No data to display

## 2022-09-09 NOTE — Discharge Instructions (Addendum)
 [facility]  Discharge instructions  Lower Extremity Surgery  Please refer to the following instruction for your post-procedure care. Your surgeon or physician assistant will discuss any changes with you.  Activity  You are encouraged to walk as much as you can. You can slowly return to normal activities during the month after your surgery. Avoid strenuous activity and heavy lifting until your doctor tells you it's OK. Avoid activities such as vacuuming or swinging a golf club. Do not drive until your doctor give the OK and you are no longer taking prescription pain medications. It is also normal to have difficulty with sleep habits, eating and bowel movement after surgery. These will go away with time.  Bathing/Showering  Shower daily after you go home. Do not soak in a bathtub, hot tub, or swim until the incision heals completely.  Incision Care  Clean your incision with mild soap and water. Shower every day. Pat the area dry with a clean towel. You do not need a bandage unless otherwise instructed. Do not apply any ointments or creams to your incision. If you have open wounds you will be instructed how to care for them or a visiting nurse may be arranged for you. If you have staples or sutures along your incision they will be removed at your post-op appointment. You may have skin glue on your incision. Do not peel it off. It will come off on its own in about one week.  Keep Pravena wound vac on your groin incision until it loses it seal in about 7-10 days.  Once that happens, you can remove and then wash the groin wound with soap and water daily and pat dry. (No tub bath-only shower)  Then put a dry gauze or washcloth in the groin to keep this area dry to help prevent wound infection.  Do this daily and as needed.  Do not use Vaseline or neosporin on your incisions.  Only use soap and water on your incisions and then protect and keep dry.   Diet  Resume  your normal diet. There are no special food restrictions following this procedure. A low fat/ low cholesterol diet is recommended for all patients with vascular disease. In order to heal from your surgery, it is CRITICAL to get adequate nutrition. Your body requires vitamins, minerals, and protein. Vegetables are the best source of vitamins and minerals. Vegetables also provide the perfect balance of protein. Processed food has little nutritional value, so try to avoid this.  Medications  Resume taking all your medications unless your doctor or physician assistant tells you not to. If your incision is causing pain, you may take over-the-counter pain relievers such as acetaminophen (Tylenol). If you were prescribed a stronger pain medication, please aware these medication can cause nausea and constipation. Prevent nausea by taking the medication with a snack or meal. Avoid constipation by drinking plenty of fluids and eating foods with high amount of fiber, such as fruits, vegetables, and grains. Take Colace 100 mg (an over-the-counter stool softener) twice a day as needed for constipation.   Do not take Tylenol if you are taking prescription pain medications.  Follow Up  Our office will schedule a follow up appointment 2-3 weeks following discharge.  Please call us immediately for any of the following conditions  Severe or worsening pain in your legs or feet while at rest or while walking Increase pain, redness, warmth, or drainage (pus) from your incision site(s) Fever of 101   degree or higher The swelling in your leg with the bypass suddenly worsens and becomes more painful than when you were in the hospital If you have been instructed to feel your graft pulse then you should do so every day. If you can no longer feel this pulse, call the office immediately. Not all patients are given this instruction.  Leg swelling is common after leg bypass surgery.  The swelling should improve over a few  months following surgery. To improve the swelling, you may elevate your legs above the level of your heart while you are sitting or resting. Your surgeon or physician assistant may ask you to apply an ACE wrap or wear compression (TED) stockings to help to reduce swelling.  Reduce your risk of vascular disease  Stop smoking. If you would like help call [facility] at 1-800-QUIT-NOW (438-888-7722) or [place] at 863-726-2558.  Manage your cholesterol Maintain a desired weight Control your diabetes weight Control your diabetes Keep your blood pressure down  If you have any questions, please call the office at [phone]  Information on my medicine - XARELTO (rivaroxaban)   WHY WAS XARELTO PRESCRIBED FOR YOU? Xarelto was prescribed to treat blood clots that may have been found in the veins of your legs (deep vein thrombosis) or in your lungs (pulmonary embolism) and to reduce the risk of them occurring again.  What do you need to know about Xarelto? The starting dose is one 15 mg tablet taken TWICE daily with food for the FIRST 21 DAYS then the dose is changed to one 20 mg tablet taken ONCE A DAY with your evening meal.  DO NOT stop taking Xarelto without talking to the health care provider who prescribed the medication.  Refill your prescription for 20 mg tablets before you run out.  After discharge, you should have regular check-up appointments with your healthcare provider that is prescribing your Xarelto.  In the future your dose may need to be changed if your kidney function changes by a significant amount.  What do you do if you miss a dose? If you are taking Xarelto TWICE DAILY and you miss a dose, take it as soon as you remember. You may take two 15 mg tablets (total 30 mg) at the same time then resume your regularly scheduled 15 mg twice daily the next day.  If you are taking Xarelto ONCE DAILY and you miss a dose, take it as soon as you remember on the same day then  continue your regularly scheduled once daily regimen the next day. Do not take two doses of Xarelto at the same time.   Important Safety Information Xarelto is a blood thinner medicine that can cause bleeding. You should call your healthcare provider right away if you experience any of the following: Bleeding from an injury or your nose that does not stop. Unusual colored urine (red or dark brown) or unusual colored stools (red or black). Unusual bruising for unknown reasons. A serious fall or if you hit your head (even if there is no bleeding).  Some medicines may interact with Xarelto and might increase your risk of bleeding while on Xarelto. To help avoid this, consult your healthcare provider or pharmacist prior to using any new prescription or non-prescription medications, including herbals, vitamins, non-steroidal anti-inflammatory drugs (NSAIDs) and supplements.  This website has more information on Xarelto: VisitDestination.com.br.

## 2022-09-09 NOTE — Plan of Care (Signed)
  Problem: Education: Goal: Knowledge of prescribed regimen will improve Outcome: Adequate for Discharge   Problem: Activity: Goal: Ability to tolerate increased activity will improve Outcome: Adequate for Discharge   Problem: Bowel/Gastric: Goal: Gastrointestinal status for postoperative course will improve Outcome: Adequate for Discharge   Problem: Clinical Measurements: Goal: Postoperative complications will be avoided or minimized Outcome: Adequate for Discharge Goal: Signs and symptoms of graft occlusion will improve Outcome: Adequate for Discharge   Problem: Skin Integrity: Goal: Demonstration of wound healing without infection will improve Outcome: Adequate for Discharge   Problem: Health Behavior/Discharge Planning: Goal: Ability to manage health-related needs will improve Outcome: Adequate for Discharge   Problem: Clinical Measurements: Goal: Ability to maintain clinical measurements within normal limits will improve Outcome: Adequate for Discharge Goal: Will remain free from infection Outcome: Adequate for Discharge Goal: Diagnostic test results will improve Outcome: Adequate for Discharge Goal: Respiratory complications will improve Outcome: Adequate for Discharge Goal: Cardiovascular complication will be avoided Outcome: Adequate for Discharge   Problem: Activity: Goal: Risk for activity intolerance will decrease Outcome: Adequate for Discharge   Problem: Nutrition: Goal: Adequate nutrition will be maintained Outcome: Adequate for Discharge   Problem: Pain Managment: Goal: General experience of comfort will improve Outcome: Adequate for Discharge   Problem: Elimination: Goal: Will not experience complications related to bowel motility Outcome: Adequate for Discharge Goal: Will not experience complications related to urinary retention Outcome: Adequate for Discharge   Problem: Safety: Goal: Ability to remain free from injury will improve Outcome:  Adequate for Discharge   Problem: Skin Integrity: Goal: Risk for impaired skin integrity will decrease Outcome: Adequate for Discharge

## 2022-09-09 NOTE — Progress Notes (Signed)
OT Cancellation Note  Patient Details Name: Troy Warner MRN: [id number] DOB: 10-22-1955   Cancelled Treatment:    Reason Eval/Treat Not Completed: OT screened, no needs identified, will sign off. Pt is independent.  Evern Bio 09/09/2022, 11:04 AM Berna Spare, OTR/L Acute Rehabilitation Services Office: [phone]

## 2022-09-09 NOTE — Progress Notes (Signed)
ANTICOAGULATION CONSULT NOTE - Follow Up Consult  Pharmacy Consult for heparin Indication:  femoral artery occlusion now s/p thrombectomy  Labs: Recent Labs    09/07/22 1612 09/08/22 2300 09/09/22 0706  HGB 16.1  --  13.3  HCT 48.5  --  41.6  PLT 165  --  138*  HEPARINUNFRC  --  0.26* 0.45  CREATININE 1.59*  --  1.46*     Assessment: 67yo male subtherapeutic on heparin s/p thrombectomy; no infusion issues or signs of bleeding per RN.  Goal of Therapy:  Heparin level 0.3-0.7 units/ml   Plan:  Continue heparin at 1800 units / hr Follow up AM labs  Thank you Okey Regal, PharmD 09/09/2022 9:04 AM

## 2022-09-09 NOTE — TOC Benefit Eligibility Note (Signed)
Pharmacy Patient Advocate Encounter  Insurance verification completed.    The patient is insured through [facility] Medicare Part D  Ran test claim for Xarelto 20 mg and the current 30 day co-pay is $223.39 due to a $265.00 deductible.  Will be $47.00 once deductible is met.   This test claim was processed through [facility]- copay amounts may vary at other pharmacies due to pharmacy/plan contracts, or as the patient moves through the different stages of their insurance plan.    Roland Earl, CPHT Pharmacy Patient Advocate Specialist [facility] Pharmacy Patient Advocate Team Direct Number: [phone]  Fax: [phone]

## 2022-09-10 LAB — SURGICAL PATHOLOGY

## 2022-09-11 ENCOUNTER — Telehealth: Payer: Self-pay

## 2022-09-11 NOTE — Telephone Encounter (Signed)
Pt called with questions about Prevena wound vac and showering, as he did not have a clamp on his device as instructions showed. Pt was advised to call prevena help line. I called him back to confirm he was able to get assistance. He was able to get instructions on what to do without his clamp being on device. Pt has no further questions/concerns at this time.

## 2022-09-13 ENCOUNTER — Telehealth: Payer: Self-pay

## 2022-09-13 NOTE — Telephone Encounter (Signed)
Caller: Patient  Concern: POD5, significant pain in sole of foot, feels like "walking on gravel" on dorsal side of foot, foot feels a little tight with mild swelling  Pt denies discoloration, taking anticoags, strong pulse in foot  Location: L foot  Description:  worsened with increase in walking yesterday  Aggravating Factors: walking, standing Feels better with elevation  Treatments: narcotic analgesics including hydrocodone/acetaminophen (Lorcet, Lortab, Norco, Vicodin)  Procedure: Thrombectomy  Consulted: Matt, PA  Resolution: Instructed to call back if symptoms perist or go to ED if ischemic symptoms present  Next Appt: Will schedule for next week, if no improvement or worsening in symptoms

## 2022-09-15 NOTE — Discharge Summary (Signed)
Bypass Discharge Summary Patient ID: Troy Warner [id number] 67 y.o. 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 male who had undergone an EVAR in 2008 and then subsequently underwent a FEVAR in [place] in 2022.  He developed a sudden onset of left leg pain and numbness on 08/29/2022.  He ultimately presented to the emergency department when his symptoms were not getting better and was transferred to [facility] for vascular evaluation.  He had a barely audible anterior tibial signal with a Doppler he is brought for femoral thrombectomy.  Hospital Course: Troy Warner underwent endovascular aneurysm repair in 2009 in [place].  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  He subsequently underwent a FEVAR in 2022 by Dr. Shirl Harris at [facility]  He does admit to some left calf claudication but on June 13 he developed the sudden onset of pain in the left calf and numbness in the foot.  He was evaluated elsewhere where apparently had a venous duplex scan that was unremarkable.  His symptoms have persisted since that time and ultimately he elected to go to the emergency department. On presentation to ED, he underwent a CT angiogram at [facility] which showed occlusion of the left femoral artery with also some filling defect in the left popliteal artery and tibial peroneal trunk.  He was transferred to [facility] for further evaluation.   He was evaluated by Troy Warner on presentation and due to his paresthesias in left foot and dampened  doppler signals femoral thrombectomy, with popliteal and tibial thrombectomy was recommended. Therefore on 09/08/22 he was taken to the operating room and underwent 1. Left femoral thrombectomy 2. Harvesting of left great saphenous vein 3. Vein patch angioplasty left common femoral artery 4. Placement of Prevena dressing  He tolerated the surgery well and was taken to the recovery room in stable condition.   POD#1, his left leg remained well perfused with palpable dorsalis pedis pulse. Calves remained soft with increased motor and sensation. Prevena VAC in the left groin intact. Further thrombotic workup with echo did not show any source of thrombus. He was continued on Heparin IV. Tolerated ambulation. Remained hemodynamically stable. PT/OT evaluated him with no follow up recommended. He felt stable for discharge home. With unclear etiology of his thrombosis Xarelto was recommended. He was transitioned off of the IV heparin to full does Xarelto. Aspirin for the time being was discontinued and he will continue his Plavix. He otherwise was advised to continue home medications as prescribed. PDMP was reviewed and since he is on chronic narcotic pain medication for back pain no further pain medication was prescribed. Xarelto and Crestor prescriptions were sent to his pharmacy. He was given instructions on the Prevena VAC removal. He will follow up at our office in 2-3 weeks for incisional check.   Consults: None  Treatments: analgesia: Dilaudid and Hydrocodone-Acetaminophen, and Tramadol anticoagulation: LMW heparin and Xarelto, therapies: PT, OT, RN, and SW, and surgery: 1. Left femoral thrombectomy 2. Harvesting of left great saphenous vein 3. Vein patch angioplasty left common femoral artery 4. Placement of Prevena dressing  Disposition: Discharge disposition: 01-Home or Self Care       - For [facility] Registry use ---  Post-op:  Wound infection: No  Graft infection: No  Transfusion: No  If  yes, 0 units given New Arrhythmia: No Patency judged by: [ ]  Dopper only, [ ]  Palpable graft pulse, [ X] Palpable distal pulse, [ ]  ABI inc. > 0.15, [ ]  Duplex D/C Ambulatory Status: Ambulatory  Complications: MI: [ X] No, [ ]  Troponin only, [ ]  EKG or Clinical CHF: No Resp failure: [ X] none, [ ]  Pneumonia, [ ]  Ventilator Chg in renal function: [ X] none, [ ]  Inc. Cr > 0.5, [ ]  Temp. Dialysis, [ ]  Permanent dialysis Stroke: Arly.Keller ] None, [ ]  Minor, [ ]  Major Return to OR: No  Reason for return to OR: [ ]  Bleeding, [ ]  Infection, [ ]  Thrombosis, [ ]  Revision  Discharge medications: Statin use:  Yes ASA use:  No Plavix use:  Yes Beta blocker use: No  for medical reason not indicated Coumadin use: No  for medical reason high bleed risk, not indicated    Patient Instructions:  Allergies as of 09/09/2022       Reactions   Iohexol Itching   Does well with pre-medication    Ivp Dye [iodinated Contrast Media] Itching   Does well with pre-medication   Morphine Other (See Comments)   Delerium   Otezla [apremilast] Diarrhea, Nausea And Vomiting   Plaquenil [hydroxychloroquine]         Medication List     STOP taking these medications    aspirin EC 81 MG tablet       TAKE these medications    chlorpheniramine 4 MG tablet Commonly known as: CHLOR-TRIMETON Take 4 mg by mouth 2 (two) times daily as needed for allergies.   clopidogrel 75 MG tablet Commonly known as: PLAVIX Take 75 mg by mouth every evening.   docusate sodium 100 MG capsule Commonly known as: COLACE Take 100 mg by mouth at bedtime.   HYDROcodone-acetaminophen 10-325 MG tablet Commonly known as: NORCO Take 1 tablet by mouth 3 (three) times daily as needed for moderate pain or severe pain.   LORazepam 1 MG tablet Commonly known as: ATIVAN Take 1 mg by mouth daily as needed for anxiety.   nicotine polacrilex 4 MG lozenge Commonly known as: COMMIT Take 4 mg by mouth daily as needed for smoking  cessation.   pantoprazole 40 MG tablet Commonly known as: PROTONIX Take 1 tablet (40 mg total) by mouth daily.   Pepcid AC 10 MG tablet Generic drug: famotidine Take 10 mg by mouth daily as needed for heartburn or indigestion.   rosuvastatin 10 MG tablet Commonly known as: CRESTOR Take 1 tablet (10 mg total) by mouth at bedtime.   simethicone 80 MG chewable tablet Commonly known as: MYLICON Chew 80 mg by mouth every 6 (six) hours as needed for flatulence.   traMADol 50 MG tablet Commonly known as: ULTRAM Take 50 mg by mouth every 6 (six) hours as needed for moderate pain.   triamcinolone ointment 0.5 % Commonly known as: KENALOG Apply 1 Application topically daily as needed (for itching in ears).   Xarelto Starter Pack Generic drug: Rivaroxaban Starter Pack (15 mg and 20 mg) Follow package directions: Take one 15mg  tablet by mouth twice a day. On day 22, switch to one 20mg  tablet once a day. Take with food.  Discharge Care Instructions  (From admission, onward)           Start     Ordered   09/09/22 0000  Discharge wound care:       Comments: Keep Pravena wound vac on your groin incision until it loses it seal in about 7-10 days.  Once that happens, you can remove and then wash the groin wound with soap and water daily and pat dry. (No tub bath-only shower)  Then put a dry gauze or washcloth in the groin to keep this area dry to help prevent wound infection.  Do this daily and as needed.  Do not use Vaseline or neosporin on your incisions.  Only use soap and water on your incisions and then protect and keep dry.   09/09/22 1107           Activity: activity as tolerated, no driving while on analgesics, and no heavy lifting for 4 weeks Diet: cardiac diet and low fat, low cholesterol diet Wound Care: Keep Pravena wound vac on your groin incision until it loses it seal in about 7-10 days.  Once that happens, you can remove and then wash the groin  wound with soap and water daily and pat dry. (No tub bath-only shower)  Then put a dry gauze or washcloth in the groin to keep this area dry to help prevent wound infection.  Do this daily and as needed.  Do not use Vaseline or neosporin on your incisions.  Only use soap and water on your incisions and then protect and keep dry.   Follow-up with Vascular and Vein in  2-3  weeks.  Signed: Graceann Congress 09/15/2022 3:54 PM

## 2022-09-17 NOTE — Telephone Encounter (Signed)
Pt called to provide update as requested.  Reviewed pt's chart, returned call for clarification, two identifiers used. Pt still having continued pain, no improvements in symptoms. Per [place], [place], pt appt moved earlier to 09/18/22. Confirmed understanding.

## 2022-09-18 ENCOUNTER — Ambulatory Visit (INDEPENDENT_AMBULATORY_CARE_PROVIDER_SITE_OTHER): Payer: Medicare PPO | Admitting: Physician Assistant

## 2022-09-18 VITALS — BP 141/88 | HR 117 | Temp 97.8°F | Resp 22 | Ht 71.0 in | Wt 233.4 lb

## 2022-09-18 DIAGNOSIS — I998 Other disorder of circulatory system: Secondary | ICD-10-CM

## 2022-09-18 MED ORDER — GABAPENTIN 300 MG PO CAPS
300.00 mg | ORAL_CAPSULE | Freq: Two times a day (BID) | ORAL | 1 refills | Status: DC
Start: 2022-09-18 — End: 2022-09-20

## 2022-09-18 NOTE — Progress Notes (Signed)
POST OPERATIVE OFFICE NOTE    CC:  F/u for surgery  HPI:  This is a 67 y.o. male who is s/p left leg thrombectomy with saphenous vein patch angioplasty of the left common femoral artery by Dr. Edilia Bo on 09/08/2022 due to an ischemic left lower extremity.  Surgical history also significant for EVAR in 2008 and subsequent fever are in [place] in 2022.  He was added on his urgent office visit due to ongoing left foot pain.  It feels like he is walking on hot gravel and that he has something sticking into his left heel.  He is on tramadol and Norco for chronic back pain.  He has CKD with 1 functioning kidney.  He denies tobacco use.  He is trying to walk as much as possible.  He believes his left groin incisions are healing well.  He remove the incisional wound VAC after 1 week.  He is on Xarelto due to suspected embolic event in addition to his Plavix daily.  Allergies  Allergen Reactions   Iohexol Itching    Does well with pre-medication    Ivp Dye [Iodinated Contrast Media] Itching    Does well with pre-medication   Morphine Other (See Comments)    Delerium   Otezla [Apremilast] Diarrhea and Nausea And Vomiting   Plaquenil [Hydroxychloroquine]     Current Outpatient Medications  Medication Sig Dispense Refill   gabapentin (NEURONTIN) 300 MG capsule Take 1 capsule (300 mg total) by mouth 2 (two) times daily. 60 capsule 1   chlorpheniramine (CHLOR-TRIMETON) 4 MG tablet Take 4 mg by mouth 2 (two) times daily as needed for allergies.     clopidogrel (PLAVIX) 75 MG tablet Take 75 mg by mouth every evening.     docusate sodium (COLACE) 100 MG capsule Take 100 mg by mouth at bedtime.     famotidine (PEPCID AC) 10 MG tablet Take 10 mg by mouth daily as needed for heartburn or indigestion.     HYDROcodone-acetaminophen (NORCO) 10-325 MG tablet Take 1 tablet by mouth 3 (three) times daily as needed for moderate pain or severe pain.     LORazepam (ATIVAN) 1 MG tablet Take 1 mg by  mouth daily as needed for anxiety.     nicotine polacrilex (COMMIT) 4 MG lozenge Take 4 mg by mouth daily as needed for smoking cessation.     pantoprazole (PROTONIX) 40 MG tablet Take 1 tablet (40 mg total) by mouth daily. (Patient not taking: Reported on 09/08/2022) 30 tablet 0   RIVAROXABAN (XARELTO) VTE STARTER PACK (15 & 20 MG) Follow package directions: Take one 15mg  tablet by mouth twice a day. On day 22, switch to one 20mg  tablet once a day. Take with food. 51 each 0   rosuvastatin (CRESTOR) 10 MG tablet Take 1 tablet (10 mg total) by mouth at bedtime. 30 tablet 11   simethicone (MYLICON) 80 MG chewable tablet Chew 80 mg by mouth every 6 (six) hours as needed for flatulence. (Patient not taking: Reported on 09/08/2022)     traMADol (ULTRAM) 50 MG tablet Take 50 mg by mouth every 6 (six) hours as needed for moderate pain.     triamcinolone ointment (KENALOG) 0.5 % Apply 1 Application topically daily as needed (for itching in ears).     No current facility-administered medications for this visit.     ROS:  See HPI  Physical Exam:  Vitals:   09/18/22 1003  BP: (!) 141/88  Pulse: (!) 117  Resp: Marland Kitchen)  22  Temp: 97.8 F (36.6 C)  TempSrc: Temporal  SpO2: 96%  Weight: 233 lb 6.4 oz (105.9 kg)  Height: 5\' 11"  (1.803 m)    Incision: Left groin incisions are well-healed Extremities: Palpable left PT and DP pulse; no tissue changes of the left foot Neuro: A&O  Assessment/Plan:  This is a 67 y.o. male who is s/p: Left lower extremity thrombectomy with saphenous vein patch angioplasty due to an acutely ischemic L foot  -Patient describes burning and stabbing pain at the bottom of his left foot since surgery.  On exam he has a palpable left DP and PT pulse.  I do not see any tissue changes on his toes or bottom of his foot.  He is likely suffering from reperfusion pain.  Prior to revascularization, patient had loss of sensation from the knee down.  He will continue his Norco and tramadol  which she has he has been prescribed for chronic back pain.  I have added gabapentin to try to help with his left foot nerve pain.  He will continue his Xarelto and Plavix daily.  He will follow-up in 3 months with EVAR duplex and ABI.  If gabapentin does not help with the pain in his left foot we will need to refer him to pain management to get him more comfortable.   Emilie Rutter, PA-C [facility] [phone]  Clinic MD:  Edilia Bo

## 2022-09-20 ENCOUNTER — Other Ambulatory Visit: Payer: Self-pay | Admitting: *Deleted

## 2022-09-20 ENCOUNTER — Telehealth: Payer: Self-pay | Admitting: *Deleted

## 2022-09-20 DIAGNOSIS — I998 Other disorder of circulatory system: Secondary | ICD-10-CM

## 2022-09-20 MED ORDER — GABAPENTIN 300 MG PO CAPS
300.00 mg | ORAL_CAPSULE | Freq: Three times a day (TID) | ORAL | 1 refills | Status: AC
Start: 2022-09-20 — End: ?

## 2022-09-20 NOTE — Telephone Encounter (Signed)
Patient called stating he is still having a lot of pain. He states the Gabapentin is working some but still having pain he is taking this 300mg  BID. Spoke with Marisue Humble PA we can increase his Gabapentin to 300mg  TID. We are unable to RX any other pain medication since patient is on Norco and Tramadol for chronic back pain. Per note from [place] PA from last visit next step would be pain management referral. New Rx for Gabapentin sent to patient pharmacy. Patient verbalized understanding of all instructions.

## 2022-09-24 ENCOUNTER — Other Ambulatory Visit: Payer: Self-pay

## 2022-09-24 MED ORDER — ROSUVASTATIN CALCIUM 10 MG PO TABS: 10.0000 mg | ORAL_TABLET | Freq: Every day | ORAL | 0 refills | Status: AC

## 2022-09-24 MED ORDER — RIVAROXABAN 20 MG PO TABS: 20.0000 mg | ORAL_TABLET | Freq: Every day | ORAL | 11 refills | Status: AC

## 2022-10-04 ENCOUNTER — Other Ambulatory Visit: Payer: Self-pay

## 2022-10-04 DIAGNOSIS — I998 Other disorder of circulatory system: Secondary | ICD-10-CM

## 2022-10-07 ENCOUNTER — Telehealth: Payer: Self-pay

## 2022-10-07 NOTE — Telephone Encounter (Signed)
Caller: Patient  Concern: Pt states that pain is no better, needs advice  Pt's PCP increased Gabapentin to 600 mg TID. Pt is not taking any pain meds  Location: left foot  Description:  continued since surgery  Aggravating Factors: movement, walking, standing  Quality: burning and hot  Treatments:  Pt instructed to walk as much as possible, take prescribed pain meds to test efficacy for decrease in pain  Resolution: Instructed to call back if symptoms perist and keep appt for pain management clinic on 7/26  Next Appt: Keep 12/25/22 appts

## 2022-11-01 ENCOUNTER — Telehealth: Payer: Self-pay

## 2022-11-01 NOTE — Telephone Encounter (Signed)
Caller: Pt's wife, Waynetta Sandy & pt  Concern: Foot swelling intermittently, constant pain that has significantly increased, pain not controlled despite pain management involvement  Pt denies wearing compressions, no wounds, no rest pain, no DVT symptoms, toes with intermittent coldness  Location: left leg  Description:  swelling and pain since surgery, but significantly worsened x 2 wks  Treatments:  elevation  Resolution: Appointment scheduled for 1st available using recall  Next Appt: Appointment scheduled for 8/19 @ 0730 for [place] and PA

## 2022-11-04 ENCOUNTER — Ambulatory Visit (INDEPENDENT_AMBULATORY_CARE_PROVIDER_SITE_OTHER): Payer: Medicare PPO | Admitting: Physician Assistant

## 2022-11-04 ENCOUNTER — Ambulatory Visit (INDEPENDENT_AMBULATORY_CARE_PROVIDER_SITE_OTHER)
Admission: RE | Admit: 2022-11-04 | Discharge: 2022-11-04 | Disposition: A | Discharge status: Home / Self Care | Payer: Medicare PPO | Source: Ambulatory Visit | Attending: Vascular Surgery | Admitting: Vascular Surgery

## 2022-11-04 ENCOUNTER — Ambulatory Visit (HOSPITAL_COMMUNITY)
Admission: RE | Admit: 2022-11-04 | Discharge: 2022-11-04 | Disposition: A | Discharge status: Home / Self Care | Payer: Medicare PPO | Source: Ambulatory Visit | Attending: Vascular Surgery | Admitting: Vascular Surgery

## 2022-11-04 VITALS — BP 123/79 | HR 68 | Temp 97.9°F | Resp 18 | Ht 70.0 in | Wt 247.7 lb

## 2022-11-04 DIAGNOSIS — I998 Other disorder of circulatory system: Secondary | ICD-10-CM

## 2022-11-04 LAB — VAS US ABI WITH/WO TBI
Left ABI: 1.2
Right ABI: 1.04

## 2022-11-04 NOTE — Progress Notes (Signed)
POST OPERATIVE OFFICE NOTE    CC:  F/u for surgery  HPI:  This is a 67 y.o. male who is s/p left leg thrombectomy with saphenous vein patch angioplasty of the common femoral artery by Dr. Edilia Bo on 09/08/2022 due to an acutely ischemic left leg.  Surgical history also significant for EVAR in 2008 as well as FEVAR in 2022 by Dr. Shirl Harris in [facility].  He continues to have burning and stabbing pain in his left foot since surgery.  He has healed his left groin and saphenectomy incision.  He has chronic back pain.  He has been seen by the pain clinic over the past several weeks without relief.  He continues to take Plavix and Xarelto.  Allergies  Allergen Reactions   Iohexol Itching    Does well with pre-medication    Ivp Dye [Iodinated Contrast Media] Itching    Does well with pre-medication   Morphine Other (See Comments)    Delerium   Otezla [Apremilast] Diarrhea and Nausea And Vomiting   Plaquenil [Hydroxychloroquine]     Current Outpatient Medications  Medication Sig Dispense Refill   buprenorphine (BUTRANS) 10 MCG/HR PTWK Place onto the skin once a week.     chlorpheniramine (CHLOR-TRIMETON) 4 MG tablet Take 4 mg by mouth 2 (two) times daily as needed for allergies.     clopidogrel (PLAVIX) 75 MG tablet Take 75 mg by mouth every evening.     docusate sodium (COLACE) 100 MG capsule Take 100 mg by mouth at bedtime.     famotidine (PEPCID AC) 10 MG tablet Take 10 mg by mouth daily as needed for heartburn or indigestion.     gabapentin (NEURONTIN) 300 MG capsule Take 1 capsule (300 mg total) by mouth 3 (three) times daily. 90 capsule 1   HYDROcodone-acetaminophen (NORCO) 10-325 MG tablet Take 1 tablet by mouth 3 (three) times daily as needed for moderate pain or severe pain.     nicotine polacrilex (COMMIT) 4 MG lozenge Take 4 mg by mouth daily as needed for smoking cessation.     pantoprazole (PROTONIX) 40 MG tablet Take 1 tablet (40 mg total) by mouth daily. 30 tablet  0   pregabalin (LYRICA) 300 MG capsule Take 300 mg by mouth 2 (two) times daily.     rivaroxaban (XARELTO) 20 MG TABS tablet Take 1 tablet (20 mg total) by mouth daily with supper. 30 tablet 11   rosuvastatin (CRESTOR) 10 MG tablet Take 1 tablet (10 mg total) by mouth at bedtime. 30 tablet 0   simethicone (MYLICON) 80 MG chewable tablet Chew 80 mg by mouth every 6 (six) hours as needed for flatulence.     tapentadol (NUCYNTA ER) 50 MG 12 hr tablet Take 50 mg by mouth 3 (three) times daily.     triamcinolone ointment (KENALOG) 0.5 % Apply 1 Application topically daily as needed (for itching in ears).     LORazepam (ATIVAN) 1 MG tablet Take 1 mg by mouth daily as needed for anxiety. (Patient not taking: Reported on 11/04/2022)     traMADol (ULTRAM) 50 MG tablet Take 50 mg by mouth every 6 (six) hours as needed for moderate pain. (Patient not taking: Reported on 11/04/2022)     No current facility-administered medications for this visit.     ROS:  See HPI  Physical Exam:  Vitals:   11/04/22 0833  BP: 123/79  Pulse: 68  Resp: 18  Temp: 97.9 F (36.6 C)  TempSrc: Temporal  SpO2: 98%  Weight: 247 lb 11.2 oz (112.4 kg)  Height: 5\' 10"  (1.778 m)    Incision: Left groin and thigh incisions well-healed Extremities:  palpable L DP and PT pulses Neuro: A&O Abdomen:  soft, NT, ND  Assessment/Plan:  This is a 67 y.o. male who is s/p: Left leg thrombectomy with saphenous vein patch angioplasty of the common femoral artery  -Left leg well-perfused with palpable DP and PT pulses.  ABIs are within normal limits.  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  He is being seen in the pain management clinic by Dr. Roderic Ovens as well as Jesse Fall, NP.  He can be transitioned from Plavix to baby aspirin daily.  He will continue his Xarelto until he sees his hematologist next month.  Nothing further to offer him from a vascular surgery standpoint given  that he has completely healed his incisions and his left foot is well-perfused with pedal pulses.  He does not have any evidence of fluid collection or hematoma in his groin that would cause neuropathic pain.  We will repeat EVAR duplex and ABIs in 6 months.    Emilie Rutter, PA-C [facility] [phone]  Clinic MD:  Myra Gianotti

## 2022-11-25 ENCOUNTER — Other Ambulatory Visit: Payer: Self-pay

## 2022-11-25 DIAGNOSIS — I998 Other disorder of circulatory system: Secondary | ICD-10-CM

## 2022-12-25 ENCOUNTER — Ambulatory Visit: Payer: Medicare PPO

## 2022-12-25 ENCOUNTER — Encounter (HOSPITAL_COMMUNITY): Payer: Medicare PPO

## 2022-12-25 ENCOUNTER — Other Ambulatory Visit (HOSPITAL_COMMUNITY): Payer: Medicare PPO

## 2023-05-07 ENCOUNTER — Ambulatory Visit (HOSPITAL_COMMUNITY): Payer: Medicare PPO

## 2023-05-07 ENCOUNTER — Ambulatory Visit: Payer: Medicare PPO

## 2023-09-01 ENCOUNTER — Telehealth: Payer: Self-pay

## 2023-09-01 NOTE — Telephone Encounter (Signed)
 Called pt and spoke to him and his wife regarding having his PCP refill Crestor . They were also provided with the Xarelto  financial assistance number. No further questions/concerns at this time.

## 2024-05-08 IMAGING — CR DG CHEST 2V
2 series · 2 of 2 positions shown · non-contrast
Comparison: None Available.

CLINICAL DATA: Chest pain.  History of right lobectomy in 6665.

EXAM:
CHEST - 2 VIEW

[chest pa]
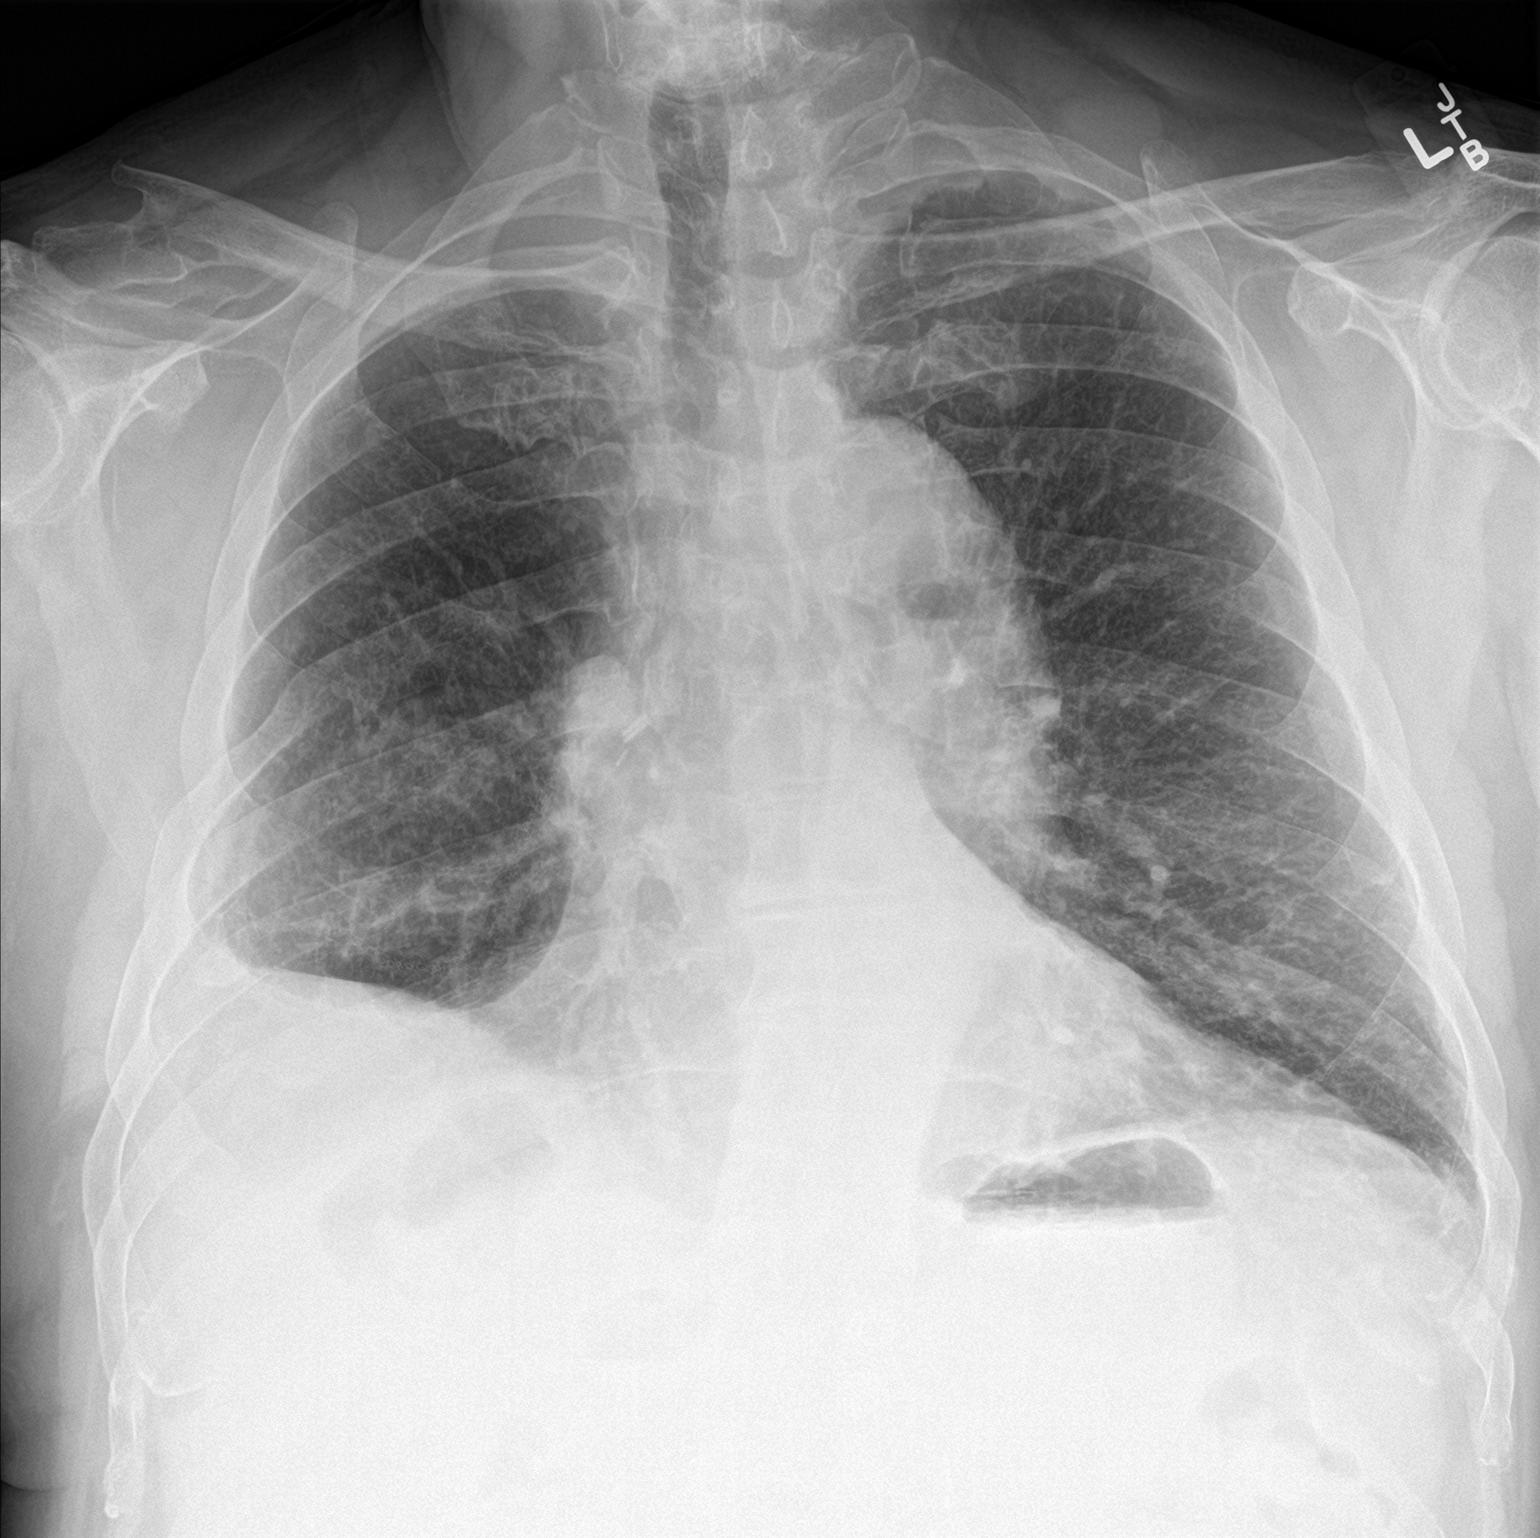

[chest lat]
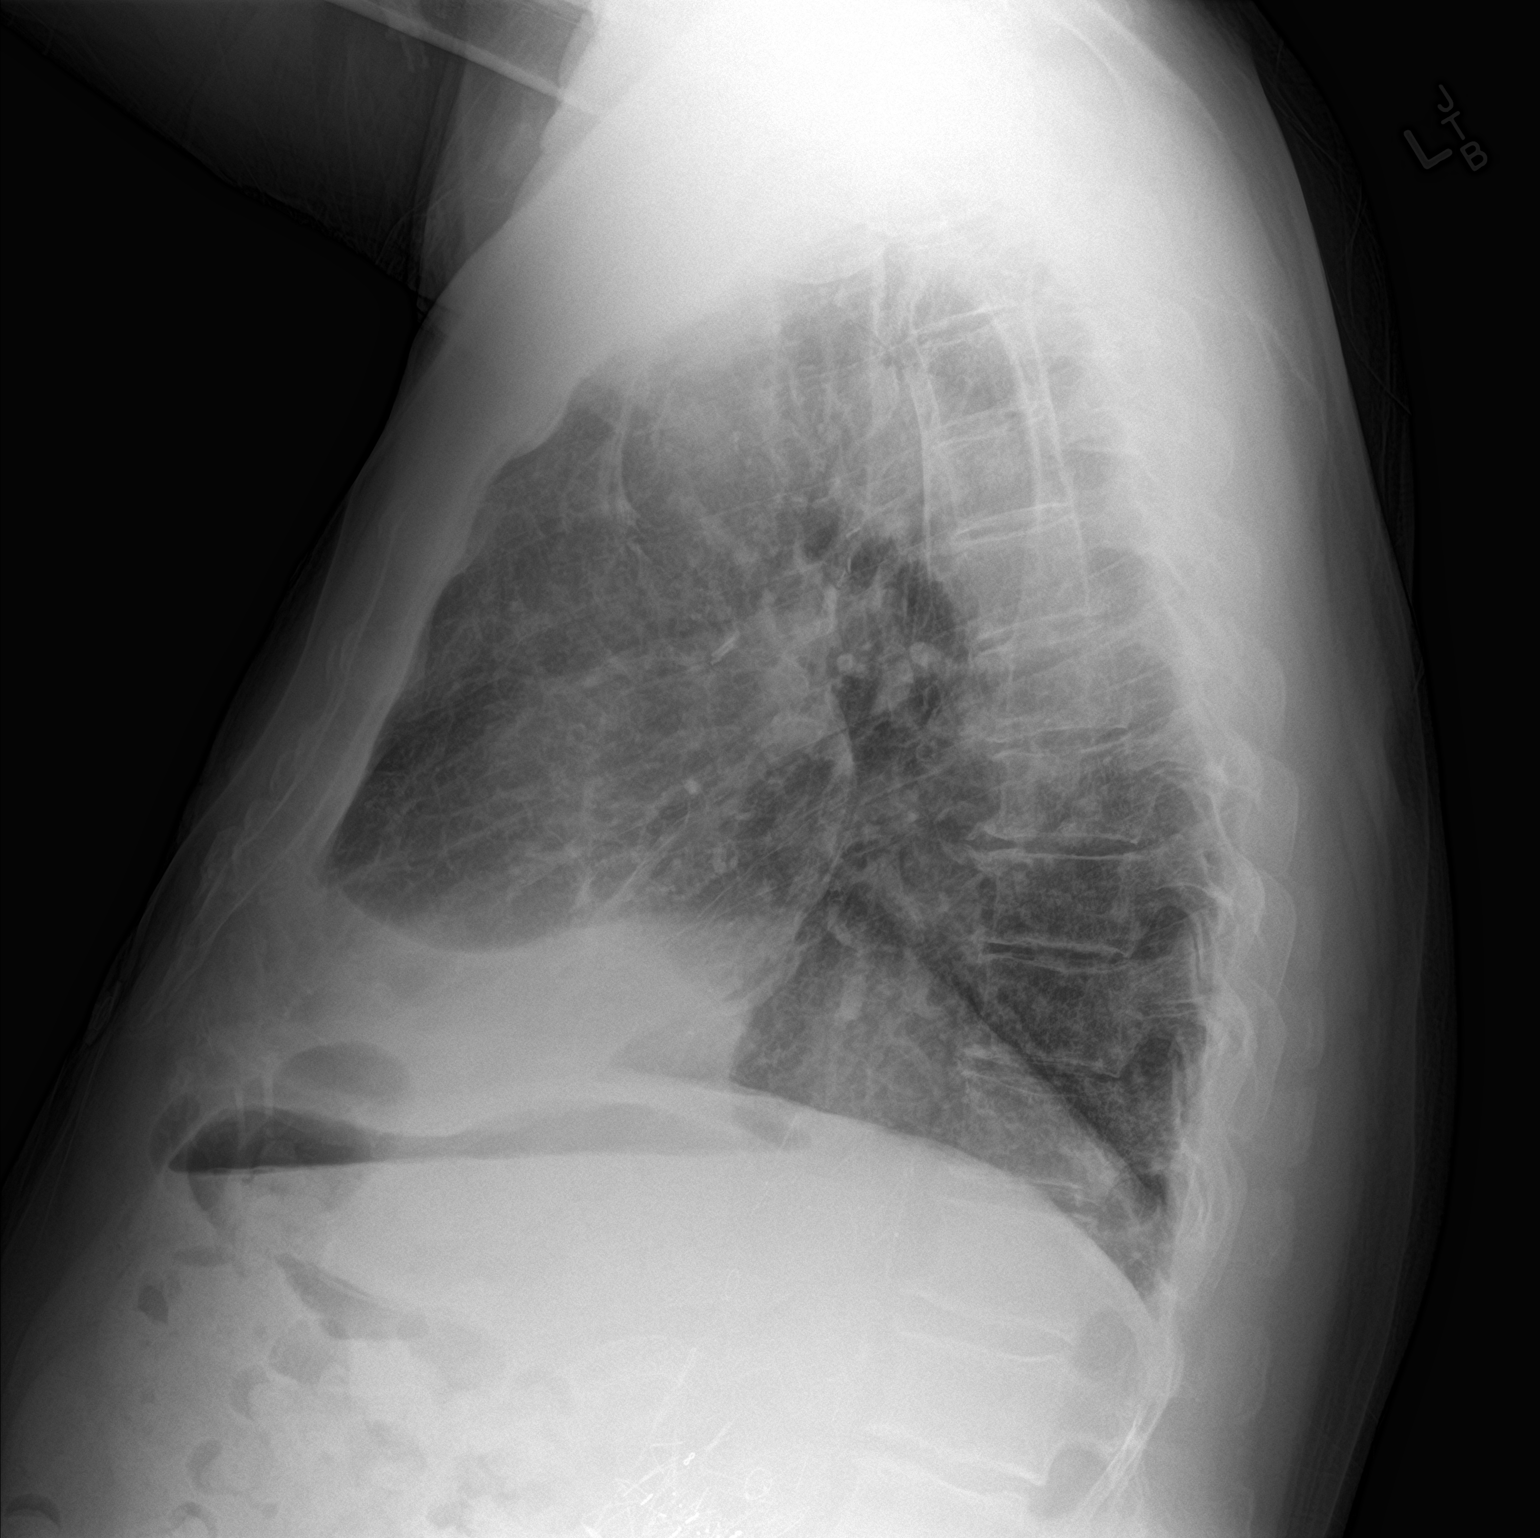

[2 of 2 positions shown; findings below may reference images not displayed]

FINDINGS: Cardiac silhouette appears mildly enlarged. Mediastinal contours are
within normal limits with moderately tortuous thoracic aorta seen.
Surgical clips overlie the right hilum. There is mildly decreased
right lung volume consistent with prior lobectomy. Old healed
fracture versus thoracotomy site within the posterolateral right
sixth rib. No focal airspace opacity to indicate pneumonia. Blunting
of the lateral and anterior right costophrenic angles presumably
postsurgical. No pleural effusion is seen at the posterior
costophrenic angles on lateral view. No pneumothorax is seen. Mild
multilevel degenerative disc changes of the thoracic spine. Surgical
clips overlie the upper abdomen. Possible vascular stent in this
region.
IMPRESSION: 1. Postsurgical changes of right lobectomy with right-sided volume
loss.
2. No definite acute lung process.

## 2024-05-09 IMAGING — CT CT ANGIO CHEST-ABD-PELV FOR DISSECTION W/ AND WO/W CM
2 of 8 series · 13 of 46 positions shown, 15 images · IV contrast (OMNI 350)
Comparison: None Available.

CLINICAL DATA: History of abdominal aortic aneurysm with prior
repair with new onset midsternal chest pain for several hours,
initial encounter

EXAM:
CT ANGIOGRAPHY CHEST, ABDOMEN AND PELVIS
TECHNIQUE: Non-contrast CT of the chest was initially obtained.

[Series 6: dissection 2mm · axial · 0.96mm/px · z∈[+784,+1402]mm · 10 of 351 slices shown, 12 images]
[im 21/351  soft-tissue]
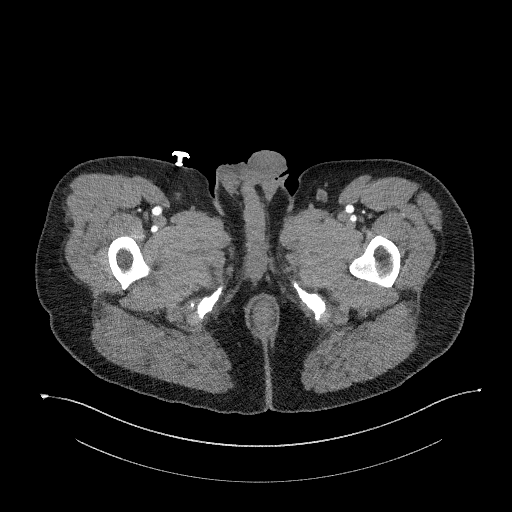
[im 21/351  bone]
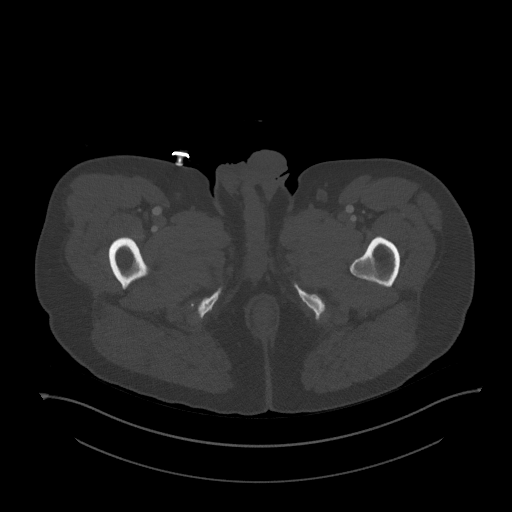
[im 62/351  soft-tissue]
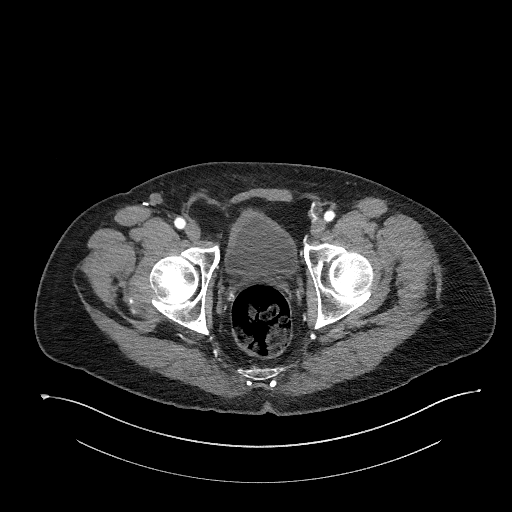
[im 103/351  soft-tissue]
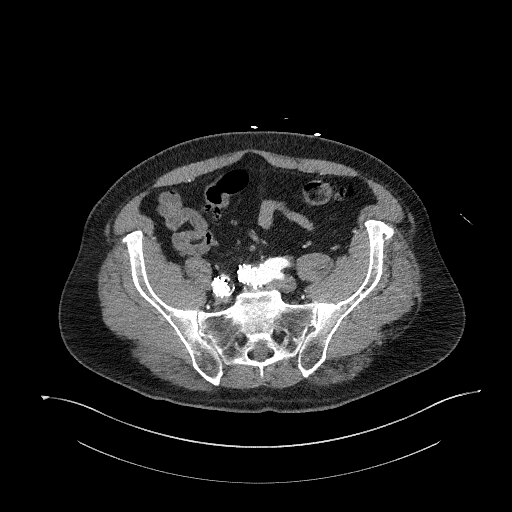
[im 124/351  soft-tissue]
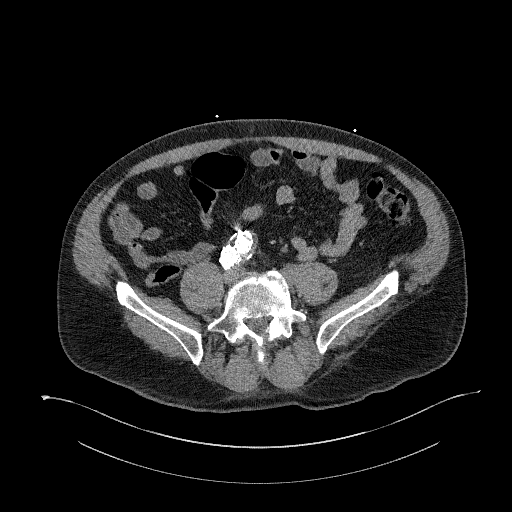
[im 165/351  soft-tissue]
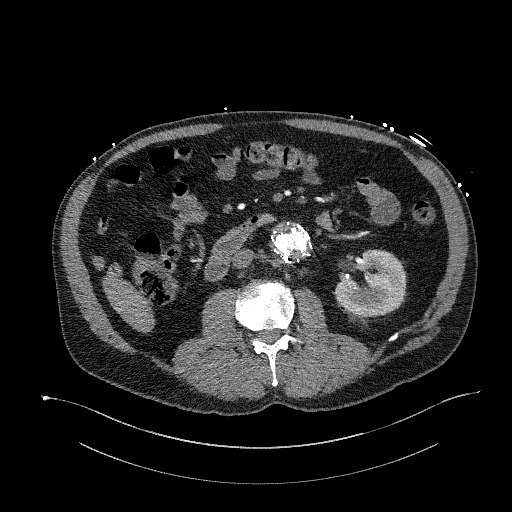
[im 186/351  soft-tissue]
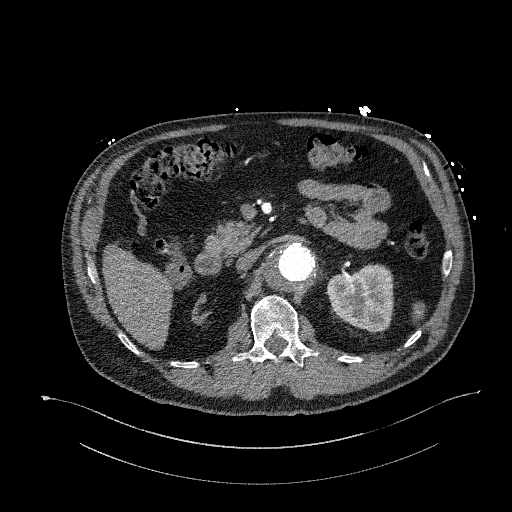
[im 227/351  soft-tissue]
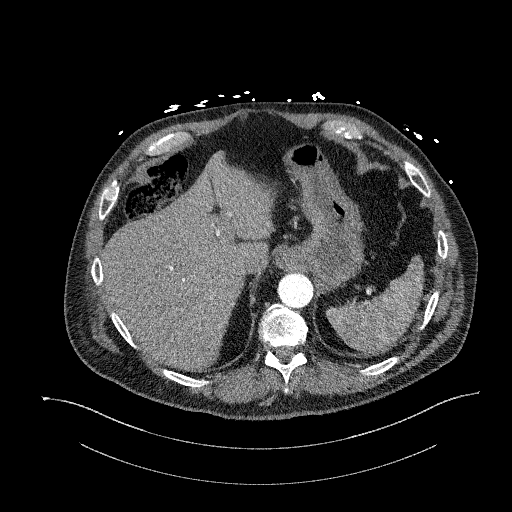
[im 268/351  soft-tissue]
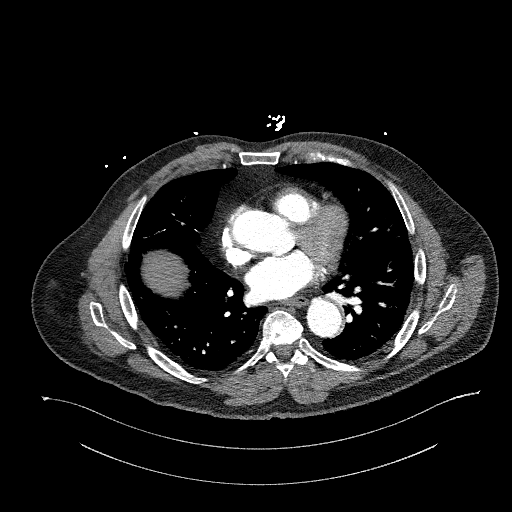
[im 289/351  soft-tissue]
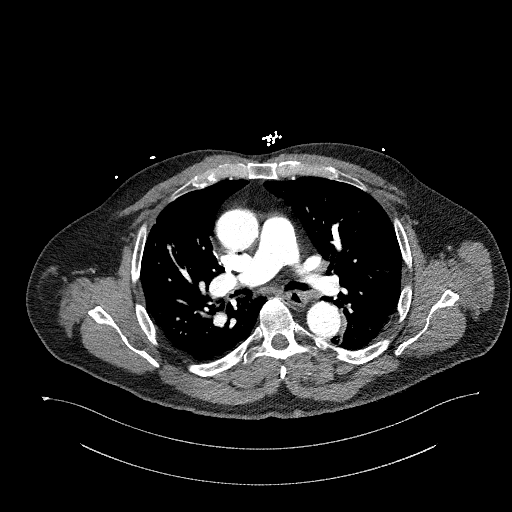
[im 289/351  bone]
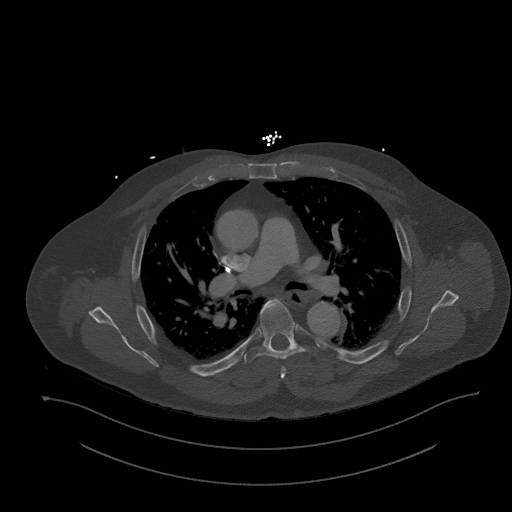
[im 330/351  soft-tissue]
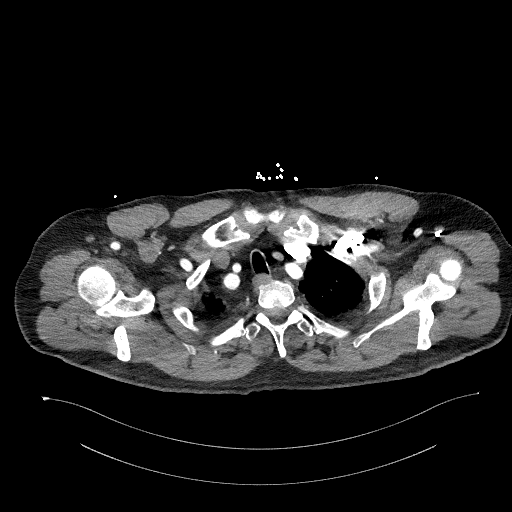

[Series 10: dissection 2mm cor · coronal · 0.84mm/px · 3 of 157 slices shown]
[im 40/157  soft-tissue]
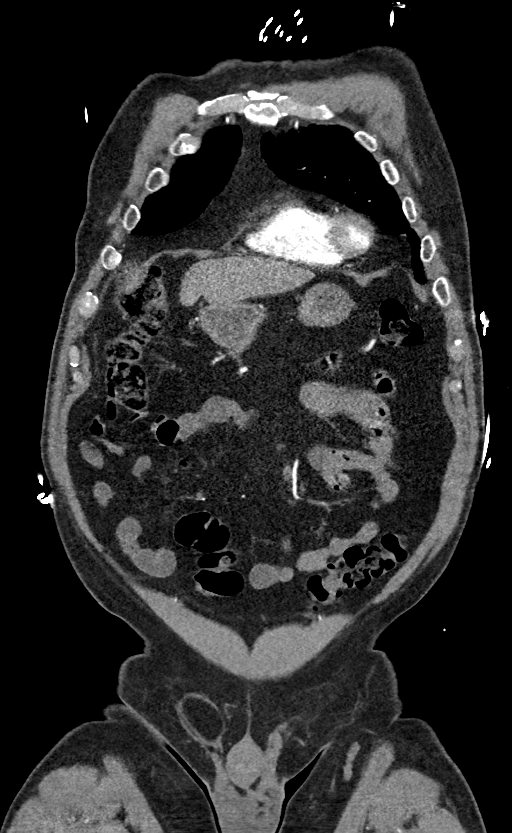
[im 79/157  soft-tissue]
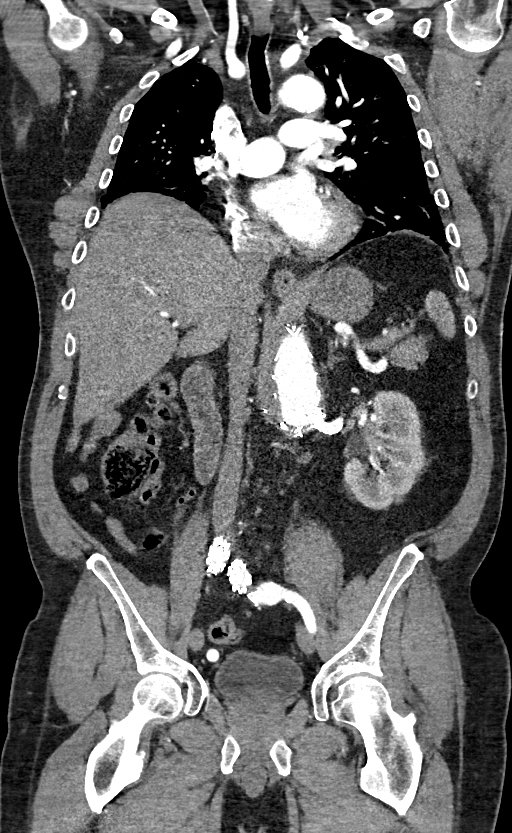
[im 118/157  soft-tissue]
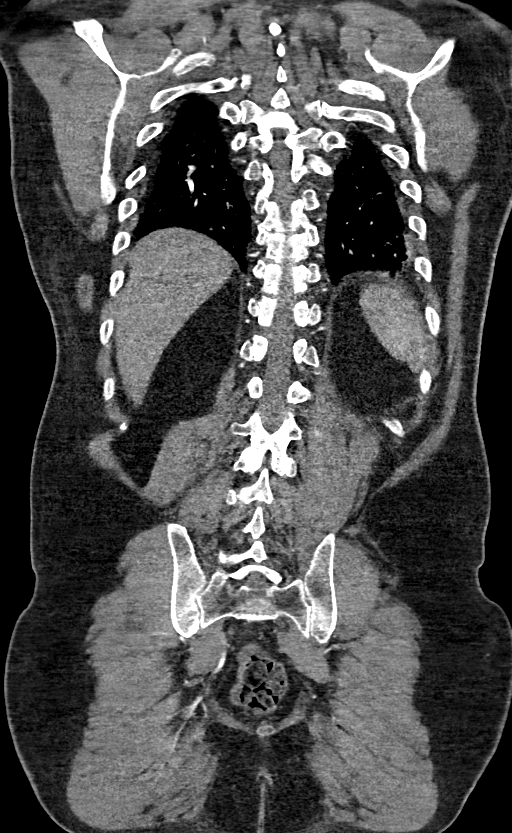

[13 of 46 positions shown; findings below may reference images not displayed]

Multidetector CT imaging through the chest, abdomen and pelvis was
performed using the standard protocol during bolus administration of
intravenous contrast. Multiplanar reconstructed images and MIPs were
obtained and reviewed to evaluate the vascular anatomy.

RADIATION DOSE REDUCTION: This exam was performed according to the
departmental dose-optimization program which includes automated
exposure control, adjustment of the mA and/or kV according to
patient size and/or use of iterative reconstruction technique.

CONTRAST:  100mL OMNIPAQUE IOHEXOL 350 MG/ML SOLN
FINDINGS: CTA CHEST FINDINGS

Cardiovascular: Initial precontrast images demonstrate no hyperdense
crescent to suggest aortic injury. Post-contrast images demonstrate
atherosclerotic calcifications without aneurysmal dilatation or
dissection. Heart is not significantly enlarged in size. Scattered
coronary calcifications are noted. Pulmonary artery shows no
evidence of pulmonary emboli.

Mediastinum/Nodes: Thoracic inlet is within normal limits. No hilar
or mediastinal adenopathy is noted. The esophagus as visualized is
within normal limits.

Lungs/Pleura: Emphysematous changes are noted primarily in the
apices. Postsurgical changes in the right lung are noted consistent
with prior lobectomy. No effusion or focal infiltrate is seen. No
parenchymal nodules are seen.

Musculoskeletal: Old healed right clavicular fracture is noted. No
acute rib abnormality is noted. Degenerative changes of the thoracic
spine seen.

Review of the MIP images confirms the above findings.

CTA ABDOMEN AND PELVIS FINDINGS

VASCULAR

Aorta: Abdominal aortic stent graft is noted which appears patent
running into the iliac limbs bilaterally. The aneurysm sac is well
excluded without evidence of endoleak. Aneurysm sac measures up to
5.5 cm in the suprarenal segment. The infrarenal component measures
approximately 4.7 cm in AP dimension and approximately 4.1 cm in
transverse dimension.

Celiac: Celiac stent is noted in place which is widely patent.

SMA: SMA stent is noted in place and widely patent.

Renals: The right renal artery has been excluded by the stent graft.
The left renal artery shows fenestrated stent from the stent graft
and is widely patent.

IMA: IMA is been excluded by the stent graft therapy although reflux
into the IMA branches is noted.

Inflow: Iliac limbs are within normal limits. Normal runoff in the
iliac vessels is seen.

Veins: No specific venous abnormality is noted.

Review of the MIP images confirms the above findings.

NON-VASCULAR

Hepatobiliary: No focal liver abnormality is seen. No gallstones,
gallbladder wall thickening, or biliary dilatation.

Pancreas: Unremarkable. No pancreatic ductal dilatation or
surrounding inflammatory changes.

Spleen: Normal in size without focal abnormality.

Adrenals/Urinary Tract: Adrenal glands are within normal limits.
Right kidney is atrophic consistent with the prior exclusion by the
stent graft therapy. Left kidney demonstrates a normal enhancement
pattern. No obstructive changes are seen. Bladder is decompressed.

Stomach/Bowel: Diverticular change of the colon is noted without
evidence of diverticulitis. No obstructive or inflammatory changes
are seen. The appendix is within normal limits. Small bowel and
stomach are unremarkable.

Lymphatic: No significant lymphadenopathy is noted.

Reproductive: Prostate is unremarkable.

Other: Fat containing right inguinal hernia is noted. No bowel is
noted within. Small fat containing umbilical hernia is noted as
well. Free fluid or free air is seen.

Musculoskeletal: Mild degenerative changes of the lumbar spine are
noted. No acute bony abnormality is seen.

Review of the MIP images confirms the above findings.
IMPRESSION: CTA of the chest: No evidence of thoracic aneurysmal dilatation or
dissection.

No evidence of pulmonary emboli.

Postsurgical changes in the right lung.

CTA of the abdomen and pelvis: Changes consistent with prior aortic
stent graft therapy without evidence of endoleak. Fenestrated stents
supplying the celiac axis, superior mesenteric artery and left renal
artery are widely patent.

Diverticulosis without diverticulitis.

Fat containing right inguinal and umbilical hernia.
